# Patient Record
Sex: Male | Born: 1965 | Race: White | Hispanic: No | Marital: Married | Smoking: Never smoker
Health system: Southern US, Community
[De-identification: ages and names within clinical notes are randomized; demographics above are authoritative.]

## PROBLEM LIST (undated history)

## (undated) DIAGNOSIS — Z86010 Personal history of colonic polyps: Secondary | ICD-10-CM

## (undated) DIAGNOSIS — T7840XA Allergy, unspecified, initial encounter: Secondary | ICD-10-CM

## (undated) DIAGNOSIS — J302 Other seasonal allergic rhinitis: Secondary | ICD-10-CM

## (undated) DIAGNOSIS — K219 Gastro-esophageal reflux disease without esophagitis: Secondary | ICD-10-CM

## (undated) DIAGNOSIS — B399 Histoplasmosis, unspecified: Secondary | ICD-10-CM

## (undated) DIAGNOSIS — Z800 Family history of malignant neoplasm of digestive organs: Secondary | ICD-10-CM

## (undated) DIAGNOSIS — Z8042 Family history of malignant neoplasm of prostate: Secondary | ICD-10-CM

## (undated) DIAGNOSIS — M1990 Unspecified osteoarthritis, unspecified site: Secondary | ICD-10-CM

## (undated) DIAGNOSIS — C4491 Basal cell carcinoma of skin, unspecified: Secondary | ICD-10-CM

## (undated) DIAGNOSIS — I639 Cerebral infarction, unspecified: Secondary | ICD-10-CM

## (undated) DIAGNOSIS — C4492 Squamous cell carcinoma of skin, unspecified: Secondary | ICD-10-CM

## (undated) DIAGNOSIS — A048 Other specified bacterial intestinal infections: Secondary | ICD-10-CM

## (undated) HISTORY — DX: Family history of malignant neoplasm of prostate: Z80.42

## (undated) HISTORY — DX: Family history of malignant neoplasm of digestive organs: Z80.0

## (undated) HISTORY — DX: Basal cell carcinoma of skin, unspecified: C44.91

## (undated) HISTORY — DX: Other seasonal allergic rhinitis: J30.2

## (undated) HISTORY — DX: Histoplasmosis, unspecified: B39.9

## (undated) HISTORY — PX: HERNIA REPAIR: SHX51

## (undated) HISTORY — DX: Other specified bacterial intestinal infections: A04.8

## (undated) HISTORY — DX: Cerebral infarction, unspecified: I63.9

## (undated) HISTORY — DX: Gastro-esophageal reflux disease without esophagitis: K21.9

## (undated) HISTORY — DX: Allergy, unspecified, initial encounter: T78.40XA

## (undated) HISTORY — DX: Personal history of colonic polyps: Z86.010

## (undated) HISTORY — PX: UPPER GASTROINTESTINAL ENDOSCOPY: SHX188

## (undated) HISTORY — PX: KNEE ARTHROSCOPY: SUR90

## (undated) HISTORY — PX: UMBILICAL HERNIA REPAIR: SHX196

## (undated) HISTORY — DX: Squamous cell carcinoma of skin, unspecified: C44.92

---

## 1989-08-18 HISTORY — PX: INGUINAL HERNIA REPAIR: SUR1180

## 2008-05-19 DIAGNOSIS — D229 Melanocytic nevi, unspecified: Secondary | ICD-10-CM

## 2008-05-19 DIAGNOSIS — C4491 Basal cell carcinoma of skin, unspecified: Secondary | ICD-10-CM

## 2008-05-19 HISTORY — DX: Basal cell carcinoma of skin, unspecified: C44.91

## 2008-05-19 HISTORY — DX: Melanocytic nevi, unspecified: D22.9

## 2013-08-18 HISTORY — PX: CYSTOSCOPY: SUR368

## 2015-04-17 ENCOUNTER — Encounter: Payer: Self-pay | Admitting: Internal Medicine

## 2015-06-18 ENCOUNTER — Telehealth: Payer: Self-pay

## 2015-06-18 NOTE — Telephone Encounter (Signed)
Left message for patient to try and get his GI records from 5-6 years ago faxed to [place] before his 06/25/15 appointment.

## 2015-06-25 ENCOUNTER — Ambulatory Visit (INDEPENDENT_AMBULATORY_CARE_PROVIDER_SITE_OTHER): Payer: Managed Care, Other (non HMO) | Admitting: Internal Medicine

## 2015-06-25 ENCOUNTER — Encounter: Payer: Self-pay | Admitting: Internal Medicine

## 2015-06-25 VITALS — BP 114/82 | HR 72 | Ht 74.0 in | Wt 221.2 lb

## 2015-06-25 DIAGNOSIS — R1011 Right upper quadrant pain: Secondary | ICD-10-CM

## 2015-06-25 DIAGNOSIS — K648 Other hemorrhoids: Secondary | ICD-10-CM

## 2015-06-25 DIAGNOSIS — R1314 Dysphagia, pharyngoesophageal phase: Secondary | ICD-10-CM

## 2015-06-25 DIAGNOSIS — R1310 Dysphagia, unspecified: Secondary | ICD-10-CM

## 2015-06-25 DIAGNOSIS — R1319 Other dysphagia: Secondary | ICD-10-CM

## 2015-06-25 NOTE — Progress Notes (Signed)
Referred by: Dr. Emelda Fear, [facility]  Subjective:    Patient ID: Andrew Ryan, male    DOB: [birth year], 49 y.o.   MRN: [id number] Cc: swallowing problems HPI The patient is here with several issues. He was originally having trouble with right upper quadrant pain that was sort of sharp and without clear triggers but also be a dull pain. He had an ultrasound that was normal i.e. no gallstones. This pain is persistent but less frequent and less intense He has also had several other problems, that occurred in the background of chronic heartburn since the 1990s which is generally been controlled on PPIs. He had an endoscopy back in the 1990s that did not show anything remarkable apparently though I don't have those records.Intermittent solid dysphagia occurs, hamburgers chicken breast and which are particular triggers, he gets intense chest pain, he will regurgitate at times. There is no unintentional weight loss or bleeding.  He is also describing a reduced frequency of stools 3x/day to sometimes 1x/day - no persistent change in stool caliber and he will often still have normal 3 times a day defecation. He also has years of symptoms with what he describes as hemorrhoids - anal irritation and swelling often after heavy lifting with slight red blood on paper - pattern same x years - prep H helps - "I have a terrible diet" not mch fiber  His primary care provider who treated him for H. pylori positive serology in the past and offered an empiric retreatment which the patient declined Medications, allergies, past medical history, past surgical history, family history and social history are reviewed and updated in the EMR.  Review of Systems As per history of present illness. All other review of systems are negative. She does have an elevated PSA which he has had in the past and then it is gone down denies any prostate symptoms    Objective:   Physical Exam @BP  114/82 mmHg  Pulse 72   Ht 6\' 2"  (1.88 m)  Wt 221 lb 4 oz (100.358 kg)  BMI 28.39 kg/m2@  General:  Well-developed, well-nourished and in no acute distress Eyes:  anicteric. ENT:   Mouth and posterior pharynx free of lesions.  Neck:   supple w/o thyromegaly or mass.  Lungs: Clear to auscultation bilaterally. Heart:  S1S2, no rubs, murmurs, gallops. Abdomen:  soft, non-tender, no hepatosplenomegaly, hernia, or mass and BS+.  Rectal: NL anoderm, no mass Prostate is NL  Anoscopy - Gr 1 internal hemorrhoids - inflamed, slightly  Lymph:  no cervical or supraclavicular adenopathy. Extremities:   no edema, cyanosis or clubbing Skin   no rash. Neuro:  A&O x 3.  Psych:  appropriate mood and  Affect.   Data Reviewed:  Primary care notes 02/14/2015 Normal CBC June 2016 normal LFTs same date normal TSH, normal ultrasound of the gallbladder July 12 16     Assessment & Plan:    1. Esophageal dysphagia   2. Abdominal pain, right upper quadrant   3. Internal hemorrhoids with complication      1. EGD w/ dilation 11/21 The risks and benefits as well as alternatives of endoscopic procedure(s) have been discussed and reviewed. All questions answered. The patient agrees to proceed. Stay on PPI Chew food well in the interim prior to endoscopy food into small pieces  benefiber for hemorrhoids I will review the importance of stopping smokeless tobacco with the patient when he returns  I appreciate the opportunity to care for this patient. CC: [place],  DO

## 2015-06-25 NOTE — Patient Instructions (Signed)
You have been scheduled for an endoscopy. Please follow written instructions given to you at your visit today. If you use inhalers (even only as needed), please bring them with you on the day of your procedure. Your physician has requested that you go to www.startemmi.com and enter the access code given to you at your visit today. This web site gives a general overview about your procedure. However, you should still follow specific instructions given to you by our office regarding your preparation for the procedure.  Today you have been given a handout to read and follow on Benefiber.  I appreciate the opportunity to care for you. Silvano Rusk, MD, [facility]

## 2015-07-09 ENCOUNTER — Ambulatory Visit (AMBULATORY_SURGERY_CENTER): Payer: Managed Care, Other (non HMO) | Admitting: Internal Medicine

## 2015-07-09 ENCOUNTER — Encounter: Payer: Self-pay | Admitting: Internal Medicine

## 2015-07-09 VITALS — BP 125/76 | HR 56 | Temp 96.4°F | Resp 12 | Ht 74.0 in | Wt 221.0 lb

## 2015-07-09 DIAGNOSIS — R1314 Dysphagia, pharyngoesophageal phase: Secondary | ICD-10-CM | POA: Diagnosis not present

## 2015-07-09 DIAGNOSIS — R1310 Dysphagia, unspecified: Secondary | ICD-10-CM

## 2015-07-09 DIAGNOSIS — R1319 Other dysphagia: Secondary | ICD-10-CM

## 2015-07-09 DIAGNOSIS — K222 Esophageal obstruction: Secondary | ICD-10-CM | POA: Diagnosis not present

## 2015-07-09 MED ORDER — SODIUM CHLORIDE 0.9 % IV SOLN: 500.0000 mL | INTRAVENOUS | Status: DC

## 2015-07-09 NOTE — Progress Notes (Signed)
Procedure ends, to recovery, report to [place], RN, VSS

## 2015-07-09 NOTE — Progress Notes (Signed)
In recovery room ,Andrew Ryan expectorated bright red blood approx. 4-5 times which was shown to Dr. Carlean Purl and Dr Carlean Purl spoke with Andrew Ryan about how he expected this amount of bleeding .

## 2015-07-09 NOTE — Progress Notes (Signed)
Called to room to assist during endoscopic procedure.  Patient ID and intended procedure confirmed with present staff. Received instructions for my participation in the procedure from the performing physician.  

## 2015-07-09 NOTE — Op Note (Signed)
[place]  [address] [place], [address]   ENDOSCOPY PROCEDURE REPORT  PATIENT: Andrew Ryan, Andrew Ryan  MR#: [id number] BIRTHDATE: 08-Jun-1966 , 49  yrs. old GENDER: male ENDOSCOPIST: Gatha Mayer, MD, [facility] PROCEDURE DATE:  07/09/2015 PROCEDURE:  EGD w/ biopsy and Maloney dilation of esophagus ASA CLASS:     Class II INDICATIONS:  dysphagia. MEDICATIONS: Propofol 400 mg IV and Monitored anesthesia care TOPICAL ANESTHETIC: none  DESCRIPTION OF PROCEDURE: After the risks benefits and alternatives of the procedure were thoroughly explained, informed consent was obtained.  The [id number] endoscope was introduced through the mouth and advanced to the second portion of the duodenum , Without limitations.  The instrument was slowly withdrawn as the mucosa was fully examined.    1) Multiple rings in esophagus suggesting eosinophilic esophagitis. Biopsies taken then at end of procedure a 54 Fr Maloney dilation performed.  Some difficulty intubating esophagus with that and some oropharyngeal mucosal trauma and heme from that. 2) Otherwies normal EGD.  Retroflexed views revealed no abnormalities.     The scope was then withdrawn from the patient and the procedure completed.  COMPLICATIONS: There were no immediate complications.  ENDOSCOPIC IMPRESSION: 1) Multiple rings in esophagus suggesting eosinophilic esophagitis. Biopsies taken then at end of procedure a 54 Fr Maloney dilation performed.  Some difficulty intubating esophagus with that and some oropharyngeal mucosal trauma and heme from that. 2) Otherwies normal EGD  RECOMMENDATIONS: 1.  Clear liquids until , then soft foods rest of day.  Resume prior diet tomorrow. 2.  Continue PPI 3.  Office will call with results/plans   eSigned:  Gatha Mayer, MD, [facility] 07/09/2015 7:55 AM    CC: Dr. Emelda Fear and The Patient

## 2015-07-09 NOTE — Patient Instructions (Addendum)
  It looks like you have a condition known as Eosinophilic Esophagitis - I took biopsies and dilated the esophagus to help.  Some of your mouth and throat lining was irritated and may bleed some but should heal quickly.  I appreciate the opportunity to care for you. Gatha Mayer, MD, FACG   YOU HAD AN ENDOSCOPIC PROCEDURE TODAY AT [facility]:   Refer to the procedure report that was given to you for any specific questions about what was found during the examination.  If the procedure report does not answer your questions, please call your gastroenterologist to clarify.  If you requested that your care partner not be given the details of your procedure findings, then the procedure report has been included in a sealed envelope for you to review at your convenience later.  YOU SHOULD EXPECT: Some feelings of bloating in the abdomen. Passage of more gas than usual.  Walking can help get rid of the air that was put into your GI tract during the procedure and reduce the bloating. If you had a lower endoscopy (such as a colonoscopy or flexible sigmoidoscopy) you may notice spotting of blood in your stool or on the toilet paper. If you underwent a bowel prep for your procedure, you may not have a normal bowel movement for a few days.  Please Note:  You might notice some irritation and congestion in your nose or some drainage.  This is from the oxygen used during your procedure.  There is no need for concern and it should clear up in a day or so.  SYMPTOMS TO REPORT IMMEDIATELY:     Following upper endoscopy (EGD)  Vomiting of blood or coffee ground material  New chest pain or pain under the shoulder blades  Painful or persistently difficult swallowing  New shortness of breath  Fever of 100F or higher  Black, tarry-looking stools  For urgent or emergent issues, a gastroenterologist can be reached at any hour by calling [phone].   DIET:follow dilatation diet  given to you today  ACTIVITY:  You should plan to take it easy for the rest of today and you should NOT DRIVE or use heavy machinery until tomorrow (because of the sedation medicines used during the test).    FOLLOW UP: Our staff will call the number listed on your records the next business day following your procedure to check on you and address any questions or concerns that you may have regarding the information given to you following your procedure. If we do not reach you, we will leave a message.  However, if you are feeling well and you are not experiencing any problems, there is no need to return our call.  We will assume that you have returned to your regular daily activities without incident.  If any biopsies were taken you will be contacted by phone or by letter within the next 1-3 weeks.  Please call us at [phone] if you have not heard about the biopsies in 3 weeks.    SIGNATURES/CONFIDENTIALITY: You and/or your care partner have signed paperwork which will be entered into your electronic medical record.  These signatures attest to the fact that that the information above on your After Visit Summary has been reviewed and is understood.  Full responsibility of the confidentiality of this discharge information lies with you and/or your care-partner.   Follow dilatation diet today

## 2015-07-10 ENCOUNTER — Telehealth: Payer: Self-pay | Admitting: *Deleted

## 2015-07-10 NOTE — Telephone Encounter (Signed)
Number identifier, left message, follow-up  

## 2015-07-15 NOTE — Progress Notes (Signed)
Quick Note:  Please call from office - biopsies were negative for any inflammation Is he able to swallow ok?  If so stay on PPI and see me prn/1 year If not then let me know  No recall or letter from [facility] ______

## 2016-05-28 ENCOUNTER — Other Ambulatory Visit: Payer: Self-pay | Admitting: Physician Assistant

## 2016-06-02 DIAGNOSIS — K219 Gastro-esophageal reflux disease without esophagitis: Secondary | ICD-10-CM | POA: Insufficient documentation

## 2016-08-18 HISTORY — PX: POLYPECTOMY: SHX149

## 2016-08-18 HISTORY — PX: COLONOSCOPY: SHX174

## 2016-09-18 DIAGNOSIS — I639 Cerebral infarction, unspecified: Secondary | ICD-10-CM

## 2016-09-18 HISTORY — DX: Cerebral infarction, unspecified: I63.9

## 2016-10-03 DIAGNOSIS — I6521 Occlusion and stenosis of right carotid artery: Secondary | ICD-10-CM | POA: Insufficient documentation

## 2016-10-05 DIAGNOSIS — I7771 Dissection of carotid artery: Secondary | ICD-10-CM | POA: Insufficient documentation

## 2016-10-05 DIAGNOSIS — G902 Horner's syndrome: Secondary | ICD-10-CM | POA: Insufficient documentation

## 2016-10-05 DIAGNOSIS — I639 Cerebral infarction, unspecified: Secondary | ICD-10-CM | POA: Insufficient documentation

## 2016-10-28 DIAGNOSIS — E782 Mixed hyperlipidemia: Secondary | ICD-10-CM | POA: Insufficient documentation

## 2017-06-25 ENCOUNTER — Encounter: Payer: Self-pay | Admitting: Internal Medicine

## 2017-07-08 ENCOUNTER — Other Ambulatory Visit: Payer: Self-pay

## 2017-07-08 ENCOUNTER — Ambulatory Visit (AMBULATORY_SURGERY_CENTER): Payer: Self-pay | Admitting: *Deleted

## 2017-07-08 VITALS — Ht 74.0 in | Wt 237.0 lb

## 2017-07-08 DIAGNOSIS — Z1211 Encounter for screening for malignant neoplasm of colon: Secondary | ICD-10-CM

## 2017-07-08 DIAGNOSIS — R195 Other fecal abnormalities: Secondary | ICD-10-CM

## 2017-07-08 NOTE — Progress Notes (Signed)
Patient denies any allergies to eggs or soy. Patient denies any problems with anesthesia/sedation. Patient denies any oxygen use at home. Patient denies taking any diet/weight loss medications or blood thinners. EMMI education assisgned to patient on colonoscopy, this was explained and instructions given to patient. 

## 2017-07-23 ENCOUNTER — Other Ambulatory Visit: Payer: Self-pay

## 2017-07-23 ENCOUNTER — Ambulatory Visit (AMBULATORY_SURGERY_CENTER): Payer: Managed Care, Other (non HMO) | Admitting: Internal Medicine

## 2017-07-23 ENCOUNTER — Encounter: Payer: Self-pay | Admitting: Internal Medicine

## 2017-07-23 VITALS — BP 128/79 | HR 57 | Temp 97.8°F | Resp 23 | Ht 74.0 in | Wt 237.0 lb

## 2017-07-23 DIAGNOSIS — D122 Benign neoplasm of ascending colon: Secondary | ICD-10-CM | POA: Diagnosis not present

## 2017-07-23 DIAGNOSIS — Z1211 Encounter for screening for malignant neoplasm of colon: Secondary | ICD-10-CM | POA: Diagnosis not present

## 2017-07-23 DIAGNOSIS — D120 Benign neoplasm of cecum: Secondary | ICD-10-CM

## 2017-07-23 DIAGNOSIS — Z1212 Encounter for screening for malignant neoplasm of rectum: Secondary | ICD-10-CM

## 2017-07-23 MED ORDER — SODIUM CHLORIDE 0.9 % IV SOLN: 500.0000 mL | Freq: Once | INTRAVENOUS | Status: DC

## 2017-07-23 NOTE — Patient Instructions (Addendum)
I found and removed 6 polyps. All look benign.  I will let you know pathology results and when to have another routine colonoscopy by mail and/or My Chart.  I appreciate the opportunity to care for you. Gatha Mayer, MD, FACG  YOU HAD AN ENDOSCOPIC PROCEDURE TODAY AT [facility]:   Refer to the procedure report that was given to you for any specific questions about what was found during the examination.  If the procedure report does not answer your questions, please call your gastroenterologist to clarify.  If you requested that your care partner not be given the details of your procedure findings, then the procedure report has been included in a sealed envelope for you to review at your convenience later.  YOU SHOULD EXPECT: Some feelings of bloating in the abdomen. Passage of more gas than usual.  Walking can help get rid of the air that was put into your GI tract during the procedure and reduce the bloating. If you had a lower endoscopy (such as a colonoscopy or flexible sigmoidoscopy) you may notice spotting of blood in your stool or on the toilet paper. If you underwent a bowel prep for your procedure, you may not have a normal bowel movement for a few days.  Please Note:  You might notice some irritation and congestion in your nose or some drainage.  This is from the oxygen used during your procedure.  There is no need for concern and it should clear up in a day or so.  SYMPTOMS TO REPORT IMMEDIATELY:   Following lower endoscopy (colonoscopy or flexible sigmoidoscopy):  Excessive amounts of blood in the stool  Significant tenderness or worsening of abdominal pains  Swelling of the abdomen that is new, acute  Fever of 100F or higher   For urgent or emergent issues, a gastroenterologist can be reached at any hour by calling [phone].   DIET:  We do recommend a small meal at first, but then you may proceed to your regular diet.  Drink plenty of  fluids but you should avoid alcoholic beverages for 24 hours.  ACTIVITY:  You should plan to take it easy for the rest of today and you should NOT DRIVE or use heavy machinery until tomorrow (because of the sedation medicines used during the test).    FOLLOW UP: Our staff will call the number listed on your records the next business day following your procedure to check on you and address any questions or concerns that you may have regarding the information given to you following your procedure. If we do not reach you, we will leave a message.  However, if you are feeling well and you are not experiencing any problems, there is no need to return our call.  We will assume that you have returned to your regular daily activities without incident.  If any biopsies were taken you will be contacted by phone or by letter within the next 1-3 weeks.  Please call us at [phone] if you have not heard about the biopsies in 3 weeks.    SIGNATURES/CONFIDENTIALITY: You and/or your care partner have signed paperwork which will be entered into your electronic medical record.  These signatures attest to the fact that that the information above on your After Visit Summary has been reviewed and is understood.  Full responsibility of the confidentiality of this discharge information lies with you and/or your care-partner.   Polyp information given.  No aspirin, naproxen, ibuprofen  or other non steroidal anti inflammatory med for 2 weeks.

## 2017-07-23 NOTE — Progress Notes (Signed)
To PACU, VSS. Report to [email or web address] 

## 2017-07-23 NOTE — Op Note (Signed)
[place] Patient Name: Andrew Ryan Procedure Date: 07/23/2017 2:56 PM MRN: [id number] Endoscopist: Gatha Mayer , MD Age: 51 Referring MD:  Date of Birth: 18-Jun-1966 Gender: Male Account #: [id number] Procedure:                Colonoscopy Indications:              Screening for colorectal malignant neoplasm, This                            is the patient's first colonoscopy Medicines:                Propofol per Anesthesia, Monitored Anesthesia Care Procedure:                Pre-Anesthesia Assessment:                           - Prior to the procedure, a History and Physical                            was performed, and patient medications and                            allergies were reviewed. The patient's tolerance of                            previous anesthesia was also reviewed. The risks                            and benefits of the procedure and the sedation                            options and risks were discussed with the patient.                            All questions were answered, and informed consent                            was obtained. Prior Anticoagulants: The patient has                            taken no previous anticoagulant or antiplatelet                            agents. ASA Grade Assessment: III - A patient with                            severe systemic disease. After reviewing the risks                            and benefits, the patient was deemed in                            satisfactory condition to undergo the procedure.  After obtaining informed consent, the colonoscope                            was passed under direct vision. Throughout the                            procedure, the patient's blood pressure, pulse, and                            oxygen saturations were monitored continuously. The                            Model CF-[id number] scope was introduced                             through the anus and advanced to the the cecum,                            identified by appendiceal orifice and ileocecal                            valve. The colonoscopy was performed without                            difficulty. The patient tolerated the procedure                            well. The quality of the bowel preparation was                            good. The ileocecal valve, appendiceal orifice, and                            rectum were photographed. The bowel preparation                            used was Miralax. Scope In: 3:05:22 PM Scope Out: 3:26:59 PM Scope Withdrawal Time: 0 hours 18 minutes 16 seconds  Total Procedure Duration: 0 hours 21 minutes 37 seconds  Findings:                 The perianal and digital rectal examinations were                            normal. Pertinent negatives include normal prostate                            (size, shape, and consistency).                           Five sessile and semi-pedunculated polyps were                            found in the ascending colon and cecum. The polyps  were 10 to 15 mm in size. These polyps were removed                            with a hot snare. Resection and retrieval were                            complete. Verification of patient identification                            for the specimen was done. Estimated blood loss:                            none.                           A 3 mm polyp was found in the cecum. The polyp was                            sessile. The polyp was removed with a cold snare.                            Resection and retrieval were complete. Verification                            of patient identification for the specimen was                            done. Estimated blood loss was minimal.                           The exam was otherwise without abnormality on                            direct and retroflexion  views. Complications:            No immediate complications. Estimated Blood Loss:     Estimated blood loss was minimal. Impression:               - Five 10 to 15 mm polyps in the ascending colon                            and in the cecum, removed with a hot snare.                            Resected and retrieved.                           - One 3 mm polyp in the cecum, removed with a cold                            snare. Resected and retrieved.                           - The examination was otherwise normal  on direct                            and retroflexion views. Recommendation:           - Patient has a contact number available for                            emergencies. The signs and symptoms of potential                            delayed complications were discussed with the                            patient. Return to normal activities tomorrow.                            Written discharge instructions were provided to the                            patient.                           - Resume previous diet.                           - Continue present medications.                           - No aspirin, ibuprofen, naproxen, or other                            non-steroidal anti-inflammatory drugs for 2 weeks                            after polyp removal.                           - Repeat colonoscopy is recommended. The                            colonoscopy date will be determined after pathology                            results from today's exam become available for                            review. Gatha Mayer, MD 07/23/2017 3:39:42 PM This report has been signed electronically.

## 2017-07-23 NOTE — Progress Notes (Signed)
Pt's states no medical or surgical changes since previsit or office visit. 

## 2017-07-24 ENCOUNTER — Telehealth: Payer: Self-pay | Admitting: *Deleted

## 2017-07-24 NOTE — Telephone Encounter (Signed)
  Follow up Call-  Call back number 07/23/2017 07/09/2015  Post procedure Call Back phone  # [phone]  Permission to leave phone message Yes Yes     Patient questions:  Do you have a fever, pain , or abdominal swelling? No. Pain Score  0 *  Have you tolerated food without any problems? Yes.    Have you been able to return to your normal activities? Yes.    Do you have any questions about your discharge instructions: Diet   No. Medications  No. Follow up visit  No.  Do you have questions or concerns about your Care? No.  Actions: * If pain score is 4 or above: No action needed, pain <4.

## 2017-07-29 ENCOUNTER — Encounter: Payer: Self-pay | Admitting: Internal Medicine

## 2017-07-29 DIAGNOSIS — Z860101 Personal history of adenomatous and serrated colon polyps: Secondary | ICD-10-CM

## 2017-07-29 DIAGNOSIS — Z86010 Personal history of colonic polyps: Secondary | ICD-10-CM

## 2017-07-29 HISTORY — DX: Personal history of colonic polyps: Z86.010

## 2017-07-29 HISTORY — DX: Personal history of adenomatous and serrated colon polyps: Z86.0101

## 2017-07-29 NOTE — Progress Notes (Signed)
6 adenomas max 15 mm recall 2021

## 2017-12-30 ENCOUNTER — Other Ambulatory Visit: Payer: Self-pay | Admitting: Physician Assistant

## 2018-08-26 ENCOUNTER — Telehealth: Payer: Self-pay

## 2018-08-26 ENCOUNTER — Encounter: Payer: Managed Care, Other (non HMO) | Admitting: Cardiothoracic Surgery

## 2018-08-26 NOTE — Telephone Encounter (Signed)
Patient contacted the office stating that his insurance denied the PET scan needed before his first appointment.  Patient stated that insurance wants a biopsy done first.  Patient advised to keep his appointment with Dr. Prescott Gum for 09/07/2018 to discuss plan for mediastinal mass.  Patient acknowledged receipt.

## 2018-09-07 ENCOUNTER — Institutional Professional Consult (permissible substitution) (INDEPENDENT_AMBULATORY_CARE_PROVIDER_SITE_OTHER): Payer: Managed Care, Other (non HMO) | Admitting: Cardiothoracic Surgery

## 2018-09-07 ENCOUNTER — Other Ambulatory Visit: Payer: Self-pay

## 2018-09-07 ENCOUNTER — Encounter: Payer: Self-pay | Admitting: Cardiothoracic Surgery

## 2018-09-07 ENCOUNTER — Other Ambulatory Visit: Payer: Self-pay | Admitting: *Deleted

## 2018-09-07 VITALS — BP 121/84 | HR 78 | Resp 18 | Ht 74.0 in | Wt 236.0 lb

## 2018-09-07 DIAGNOSIS — Z8619 Personal history of other infectious and parasitic diseases: Secondary | ICD-10-CM | POA: Diagnosis not present

## 2018-09-07 DIAGNOSIS — J9859 Other diseases of mediastinum, not elsewhere classified: Secondary | ICD-10-CM | POA: Diagnosis not present

## 2018-09-07 NOTE — Progress Notes (Signed)
PCP is Emelda Fear, DO Referring Provider is Festus Aloe, MD  Chief Complaint  Patient presents with  . Mediastinal Mass    New patient consultation, Chest CT   Patient examined, images of chest CT scan performed earlier this month personally reviewed and counseled with patient and wife. HPI: 53 year old healthy Caucasian male non-smoker was evaluated by urology[Dr. Eskridge] for elevated PSA.  There is a positive family history of prostate cancer.  Screening CT scan of the chest and abdomen was performed.  A prostate biopsy was performed which was negative.  The chest CT scan showed a 6 to 7 cm ovoid partially calcified probable lymph node in the middle mediastinum between the ascending aorta and the superior vena cava.  There was surrounding smaller calcified lymph nodes in the right hilum.  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  He was subsequently treated with antifungal agent by his primary physician.  The patient probably received amphotericin B because he stated he was quite ill for a few weeks after treatment.  We will try to obtain records of that therapy.  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  The superior vena cava has definite external compression but remains patent.  There is no erosion into the aorta.  The patient demonstrates few symptoms of this mediastinal enlarged lymph node-no cough, no swelling of the neck or upper extremities, no varicosities from collaterals forming in his lower neck and upper chest, no hemoptysis, no broncholith production.  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  We will try to obtain records and copies of the CT images.  A repeat mediastinoscopy and biopsy would be extremely dangerous because of the previous procedure and adhesions in the mediastinum.  The risk for hemorrhage would be prohibitive.  I have recommended to the patient we proceed with a PET scan to see if there is metabolic activity present.  If low-level metabolic activity is present then I would refer the patient to infectious disease to consider further medical therapy.  If the mass shows extreme metabolic activity then there would be a concern over malignant transformation.  Currently the patient has no symptoms of vascular or airway compromise by this large lymphoid mass.  He will continue his current job and normal activities without restriction at this time.  Past Medical History:  Diagnosis Date  . GERD (gastroesophageal reflux disease)   . H. pylori infection   . Histoplasmosis   . Hx of adenomatous colonic polyps 07/29/2017  . Skin cancer, basal cell   . Squamous cell skin cancer   . Stroke [facility]) 09/2016    Past Surgical History:  Procedure Laterality Date  . HERNIA REPAIR     x 2 umbilical and inguinal  . KNEE ARTHROSCOPY Bilateral   . UPPER GASTROINTESTINAL ENDOSCOPY      Family History  Problem Relation Age of Onset  . Congestive Heart Failure Father   . COPD Father   . Lung cancer Paternal Grandfather        smoker, Ecologist  . Lung cancer Maternal Grandfather  smoker, Ecologist  . Heart attack Maternal Grandfather   . Colon cancer Neg Hx   . Esophageal cancer Neg Hx   . Stomach cancer Neg Hx     Social History Social History   Tobacco Use  . Smoking status: Never Smoker  . Smokeless tobacco: Never Used  Substance Use Topics  . Alcohol use: Yes    Alcohol/week: 0.0 standard drinks    Comment: occ  . Drug use: No    Current Outpatient Medications  Medication Sig Dispense  Refill  . ASPIRIN 81 PO Take 1 tablet by mouth daily.    Marland Kitchen atorvastatin (LIPITOR) 20 MG tablet Take 1 tablet by mouth daily.  6  . B Complex-C (SUPER B COMPLEX PO) Take 1 tablet by mouth daily.    . cetirizine (ZYRTEC) 10 MG tablet Take 10 mg by mouth daily.    Marland Kitchen omeprazole (PRILOSEC) 40 MG capsule Take 1 capsule by mouth daily.     Current Facility-Administered Medications  Medication Dose Route Frequency Provider Last Rate Last Dose  . 0.9 %  sodium chloride infusion  500 mL Intravenous Once Gatha Mayer, MD        No Known Allergies  Review of Systems                     Review of Systems :  [ y ] = yes, [  ] = no        General :  Weight gain [   ]    Weight loss  [   ]  Fatigue [  ]  Fever [  ]  Chills  [  ]                                          HEENT    Headache [  ]  Dizziness [  ]  Blurred vision [  ] Glaucoma  [  ]                          Nosebleeds [  ] Painful or loose teeth [  ]        Cardiac :  Chest pain/ pressure [  ]  Resting SOB [  ] exertional SOB [  ]                        Orthopnea [  ]  Pedal edema  [  ]  Palpitations [  ] Syncope/presyncope [ ]                         Paroxysmal nocturnal dyspnea [  ]         Pulmonary : cough [  ]  wheezing [  ]  Hemoptysis [  ] Sputum [  ] Snoring [  ]                              Pneumothorax [  ]  Sleep apnea [  ]        GI : Vomiting [  ]  Dysphagia [ y ]  Melena  [  ]  Abdominal pain [  ] BRBPR [  ]  Heart burn [ yes long history of GERD]  Constipation [  ] Diarrhea  [  ] Colonoscopy [   ]        GU : Hematuria [  ]  Dysuria [  ]  Nocturia [  ] UTI's [  ]        Vascular : Claudication [  ]  Rest pain [  ]  DVT [  ] Vein stripping [  ] leg ulcers [  ]                          TIA [  ] Stroke [history spontaneous right carotid dissection 2018 without neurologic sequela, treated nonsurgically]  Varicose veins [  ]        NEURO :  Headaches  [  ] Seizures [  ] Vision changes [  ] Paresthesias [   ]      right-hand-dominant                                         Musculoskeletal :  Arthritis [  ] Gout  [  ]  Back pain [  ]  Joint pain [  ]        Skin :  Rash [  ]  Melanoma [  ] Sores [  ]        Heme : Bleeding problems [  ]Clotting Disorders [  ] Anemia [  ]Blood Transfusion [ ]         Endocrine : Diabetes [  ] Heat or Cold intolerance [  ] Polyuria [  ]excessive thirst [ ]         Psych : Depression [  ]  Anxiety [  ]  Psych hospitalizations [  ] Memory change [  ]                                                                            BP 121/84 (BP Location: Right Arm, Patient Position: Sitting, Cuff Size: Large)   Pulse 78   Resp 18   Ht 6\' 2"  (1.88 m)   Wt 236 lb (107 kg)   SpO2 97% Comment: RA  BMI 30.30 kg/m  Physical Exam      Physical Exam  General: Well-nourished middle-aged Caucasian male no acute distress accompanied by wife HEENT: Normocephalic pupils equal , dentition adequate Neck: Supple without JVD, adenopathy, or bruit.  Well-healed surgical scar at the sternal notch from previous mediastinoscopy. Chest: Clear to auscultation, symmetrical breath sounds, no rhonchi, no tenderness             or deformity Cardiovascular: Regular rate and rhythm, no murmur, no gallop, peripheral pulses             palpable in all extremities Abdomen:  Soft, nontender, no palpable mass or organomegaly Extremities: Warm, well-perfused, no clubbing cyanosis edema or tenderness,              no venous stasis changes of the legs Rectal/GU: Deferred Neuro: Grossly non--focal and symmetrical throughout Skin: Clean  and dry without rash or ulceration  Diagnostic Tests: CT of chest images personally reviewed and demonstrated to patient and wife.  Large partially calcified lymphoid mass in the mid mediastinum between the aorta and SVC most likely result of previous histoplasmosis.  Scattered calcified smaller nodes at the right hilum.  The size of the mass is unusual and  I recommended PET scan to assess the malignant potential.  Repeat biopsy of this area would be extremely high risk for hemorrhage because of the previous mediastinoscopy and scarred dissection planes through this region of major blood vessels [innominate artery, superior vena cava].  Transthoracic biopsy would not be possible.  Impression: Incidental finding of a large mediastinal lymphoid calcified mass probably related to previous histoplasmosis.  This is asymptomatic.  Plan: Proceed with PET scan.  Patient may need consultation by infectious disease.  We will see patient back to discuss results of PET scan.   Len Childs, MD [facility] [phone]

## 2018-09-09 ENCOUNTER — Encounter: Payer: Self-pay | Admitting: *Deleted

## 2018-09-17 ENCOUNTER — Ambulatory Visit (HOSPITAL_COMMUNITY)
Admission: RE | Admit: 2018-09-17 | Discharge: 2018-09-17 | Disposition: A | Discharge status: Home / Self Care | Payer: Managed Care, Other (non HMO) | Source: Ambulatory Visit | Attending: Cardiothoracic Surgery | Admitting: Cardiothoracic Surgery

## 2018-09-17 DIAGNOSIS — J9859 Other diseases of mediastinum, not elsewhere classified: Secondary | ICD-10-CM | POA: Diagnosis present

## 2018-09-17 LAB — GLUCOSE, CAPILLARY: Glucose-Capillary: 99 mg/dL (ref 70–99)

## 2018-09-17 IMAGING — PT NM PET TUM IMG INITIAL (PI) SKULL BASE T - THIGH
1 of 8 series · 1 of 25 positions shown · non-contrast
Comparison: None.

CLINICAL DATA: Initial treatment strategy for mediastinal mass.

EXAM:
NUCLEAR MEDICINE PET SKULL BASE TO THIGH
TECHNIQUE: 11.1 mCi F-18 FDG was injected intravenously. Full-ring PET imaging
was performed from the skull base to thigh after the radiotracer. CT
data was obtained and used for attenuation correction and anatomic
localization.
Fasting blood glucose: 99 mg/dl

[Series 4: ct sk_thigh 5.0 b31f · axial · 5.0mm · 0.98mm/px · 1 of 244 slices shown]
[im 244/244  brain]
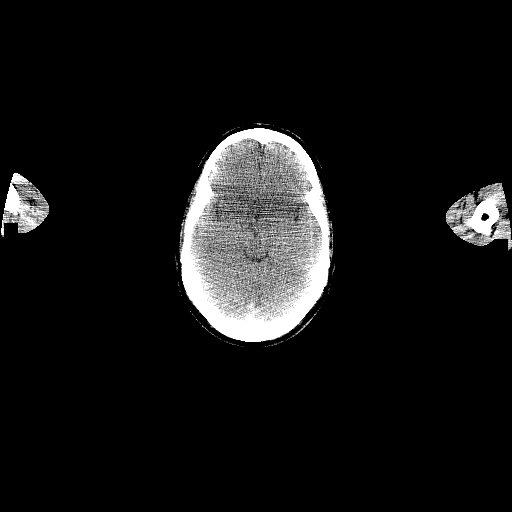

[1 of 25 positions shown; findings below may reference images not displayed]

FINDINGS: Mediastinal blood pool activity: SUV max

NECK: No hypermetabolic lymph nodes in the neck.

Incidental CT findings: none

CHEST: The periphery calcified multilobar anterior mediastinal mass
is completely devoid of metabolic activity consistent with a benign
a vascularized lesion. There is a subtle rim of metabolic activity
along the anterior inferior margin of the calcified mass SUV max
equal

No hypermetabolic or suspicious pulmonary nodules. There is a
calcified nodule in the azygoesophageal recess. Calcified hilar
lymph nodes on the RIGHT noted

Incidental CT findings: none

ABDOMEN/PELVIS: No abnormal metabolic activity in the abdomen
pelvis.

Granulomata within the spleen.  Benign cysts of the LEFT kidney

Incidental CT findings: none

SKELETON: No focal hypermetabolic activity to suggest skeletal
metastasis. Mild expansion of the posterior LEFT eighth rib without
associated metabolic activity.

Incidental CT findings: none
IMPRESSION: 1. Large calcified anterior mediastinal mass is devoid of metabolic
activity which would indicate a benign avascular lesion. Trace mild
uptake along the margin of the lesion inferiorly is favored
inflammatory.
2. Additional findings of granulomatous disease including calcified
hilar nodes, calcified pulmonary nodule, and splenic granulomata.
Favor the large calcified mediastinal mass to be part of this same
benign granulomatous process.

## 2018-09-17 MED ORDER — FLUDEOXYGLUCOSE F - 18 (FDG) INJECTION
11.1000 | Freq: Once | INTRAVENOUS | Status: AC | PRN
  Administered 2018-09-17: 11.1 via INTRAVENOUS

## 2018-09-22 ENCOUNTER — Ambulatory Visit (INDEPENDENT_AMBULATORY_CARE_PROVIDER_SITE_OTHER): Payer: Managed Care, Other (non HMO) | Admitting: Cardiothoracic Surgery

## 2018-09-22 ENCOUNTER — Encounter: Payer: Self-pay | Admitting: Cardiothoracic Surgery

## 2018-09-22 VITALS — BP 130/89 | HR 67 | Resp 20 | Ht 74.0 in | Wt 238.0 lb

## 2018-09-22 DIAGNOSIS — J9859 Other diseases of mediastinum, not elsewhere classified: Secondary | ICD-10-CM | POA: Diagnosis not present

## 2018-09-22 NOTE — Progress Notes (Signed)
PCP is Emelda Fear, DO Referring Provider is Festus Aloe, MD  Chief Complaint  Patient presents with  . Mediastinal Mass    f/u after PET Scan 09/17/18    HPI: Patient returns for further evaluation and review of PET scan results.  The large 8 cm mid mediastinal mass close to the ascending aorta and trachea had zero metabolic activity consistent with a scarred, avascular lymph node mass from remote history of histoplasmosis at age 53.  No further biopsy or evaluation is needed.  The patient does not need resection.  The images were demonstrated to the patient.  It was noted that he had a large renal cyst on the left kidney.  He will be followed up with Dr. Junious Silk regarding this finding.   Past Medical History:  Diagnosis Date  . GERD (gastroesophageal reflux disease)   . H. pylori infection   . Histoplasmosis   . Hx of adenomatous colonic polyps 07/29/2017  . Skin cancer, basal cell   . Squamous cell skin cancer   . Stroke [facility]) 09/2016    Past Surgical History:  Procedure Laterality Date  . HERNIA REPAIR     x 2 umbilical and inguinal  . KNEE ARTHROSCOPY Bilateral   . UPPER GASTROINTESTINAL ENDOSCOPY      Family History  Problem Relation Age of Onset  . Congestive Heart Failure Father   . COPD Father   . Lung cancer Paternal Grandfather        smoker, Ecologist  . Lung cancer Maternal Grandfather        smoker, Ecologist  . Heart attack Maternal Grandfather   . Colon cancer Neg Hx   . Esophageal cancer Neg Hx   . Stomach cancer Neg Hx     Social History Social History   Tobacco Use  . Smoking status: Never Smoker  . Smokeless tobacco: Never Used  Substance Use Topics  . Alcohol use: Yes    Alcohol/week: 0.0 standard drinks    Comment: occ  . Drug use: No    Current Outpatient Medications  Medication Sig Dispense Refill  . ASPIRIN 81 PO Take 1 tablet by mouth daily.    Marland Kitchen atorvastatin (LIPITOR) 20 MG tablet Take 1 tablet by mouth daily.  6  . B  Complex-C (SUPER B COMPLEX PO) Take 1 tablet by mouth daily.    . cetirizine (ZYRTEC) 10 MG tablet Take 10 mg by mouth daily.    Marland Kitchen omeprazole (PRILOSEC) 40 MG capsule Take 1 capsule by mouth daily.     Current Facility-Administered Medications  Medication Dose Route Frequency Provider Last Rate Last Dose  . 0.9 %  sodium chloride infusion  500 mL Intravenous Once Gatha Mayer, MD        No Known Allergies  Review of Systems  Chest pain No fever Shortness of breath  BP 130/89   Pulse 67   Resp 20   Ht 6\' 2"  (1.88 m)   Wt 238 lb (108 kg)   SpO2 98% Comment: RA  BMI 30.56 kg/m  Physical Exam      Exam    General- alert and comfortable    Neck- no JVD, no cervical adenopathy palpable, no carotid bruit   Lungs- clear without rales, wheezes   Cor- regular rate and rhythm, no murmur , gallop   Abdomen- soft, non-tender   Extremities - warm, non-tender, minimal edema   Neuro- oriented, appropriate, no focal weakness   Diagnostic Tests: PET scan shows  the mediastinal mass to have zero metabolic activity consistent with an avascular scar lymphoid mass from previous histoplasmosis.  No evidence of active disease  Impression: Benign mediastinal mass, no further evaluation or treatment needed  Plan: Return as needed   Len Childs, MD [facility] [phone]

## 2019-04-19 ENCOUNTER — Other Ambulatory Visit: Payer: Self-pay | Admitting: Urology

## 2019-04-19 DIAGNOSIS — N281 Cyst of kidney, acquired: Secondary | ICD-10-CM

## 2019-06-30 ENCOUNTER — Other Ambulatory Visit: Payer: Self-pay

## 2019-06-30 ENCOUNTER — Ambulatory Visit
Admission: RE | Admit: 2019-06-30 | Discharge: 2019-06-30 | Disposition: A | Discharge status: Home / Self Care | Payer: Managed Care, Other (non HMO) | Source: Ambulatory Visit | Attending: Urology | Admitting: Urology

## 2019-06-30 DIAGNOSIS — N281 Cyst of kidney, acquired: Secondary | ICD-10-CM

## 2019-06-30 IMAGING — MR MR ABDOMEN WO/W CM
11 of 17 series · 29 of 48 positions shown · IV contrast (multihance)
Comparison: [DATE]

CLINICAL DATA: Follow-up indeterminate left renal lesion.

EXAM:
MRI ABDOMEN WITHOUT AND WITH CONTRAST
TECHNIQUE: Multiplanar multisequence MR imaging of the abdomen was performed
both before and after the administration of intravenous contrast.
CONTRAST:  20mL MULTIHANCE GADOBENATE DIMEGLUMINE 529 MG/ML IV SOLN

[Series 3: T2 · coronal · 5.0mm · 1.56mm/px · 2 of 25 slices shown (1 of 3)]
[im 1/25]
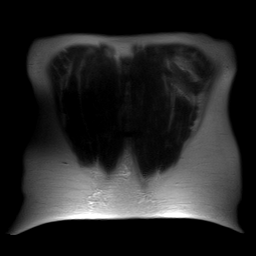
[im 25/25]
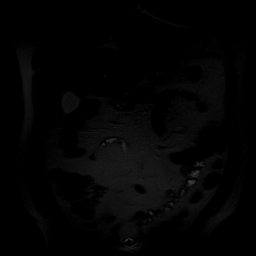

[Series 4: axial tru fisp · axial · 4.5mm · 1.48mm/px · z∈[-87,+107]mm · 2 of 34 slices shown]
[im 1/34]
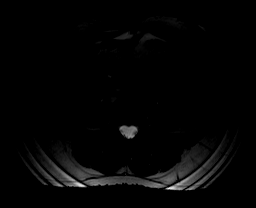
[im 34/34]
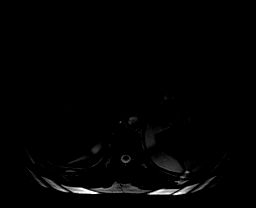

[Series 5: ep2d_diff_b50_500_800_p2 · axial · 6.0mm · 1.98mm/px · z∈[-99,+127]mm · 5 of 90 slices shown]
[im 1/90]
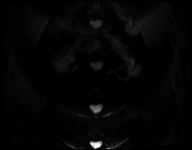
[im 23/90]
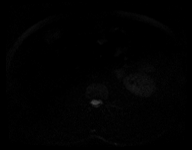
[im 45/90]
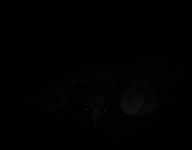
[im 67/90]
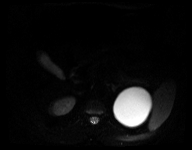
[im 90/90]
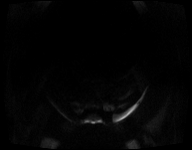

[Series 6: [id number]_[id number]_adc · axial · 6.0mm · 1.98mm/px · z∈[-99,+127]mm · 2 of 30 slices shown]
[im 1/30]
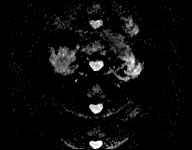
[im 30/30]
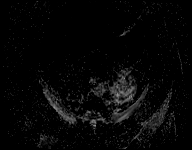

[Series 7: T2 · axial · 6.5mm · 0.74mm/px · z∈[-112,+130]mm · 2 of 32 slices shown (2 of 3)]
[im 1/32]
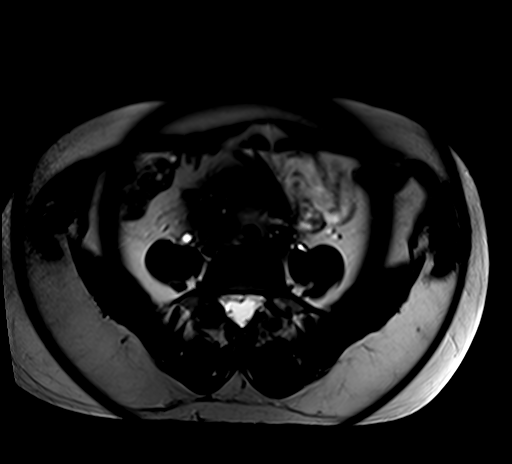
[im 32/32]
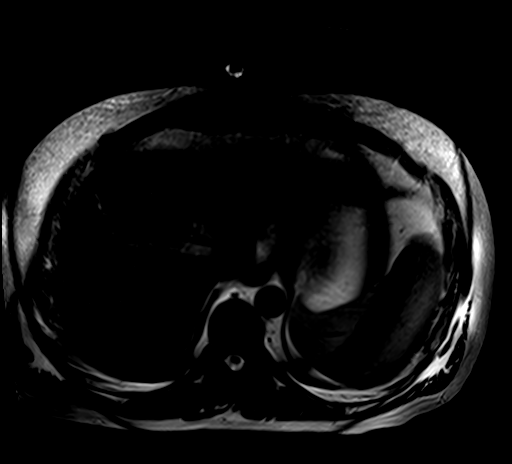

[Series 8: T2 · axial · 5.0mm · 1.37mm/px · z∈[-82,+107]mm · 2 of 30 slices shown (3 of 3)]
[im 1/30]
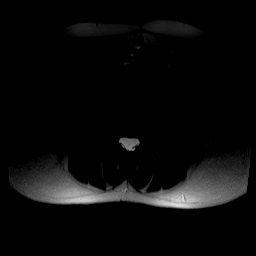
[im 30/30]
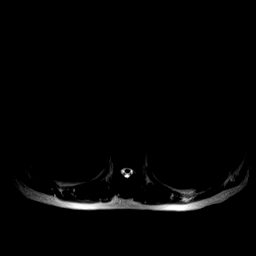

[Series 9: axial in out · axial · 5.5mm · 0.74mm/px · z∈[-79,+104]mm · 3 of 60 slices shown]
[im 1/60]
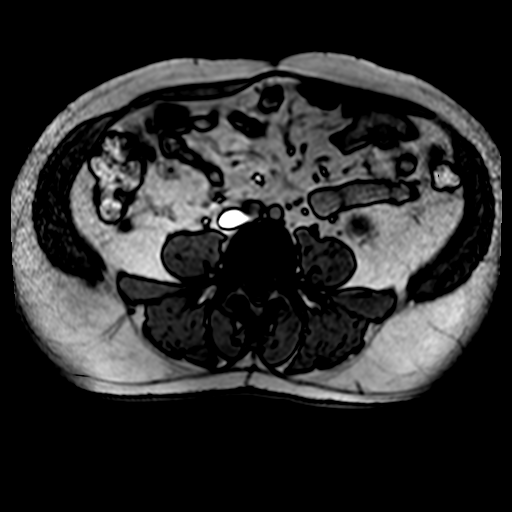
[im 30/60]
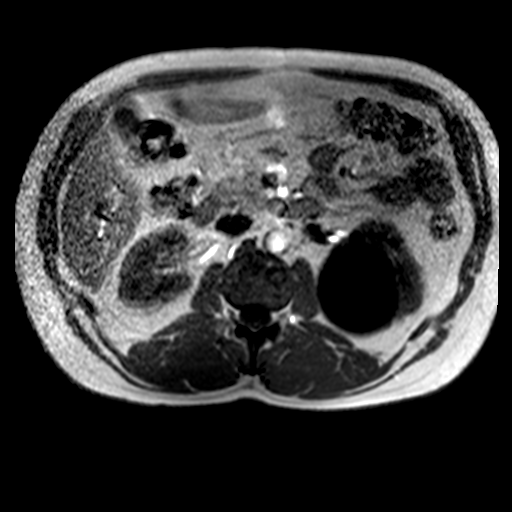
[im 60/60]
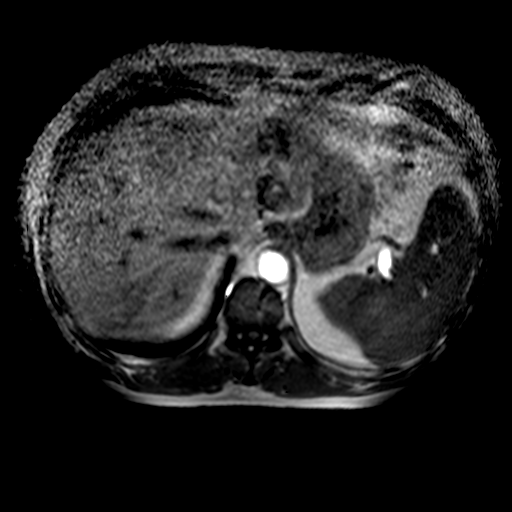

[Series 10: T1 dynamic · axial · non-contrast · 3.0mm · 0.78mm/px · z∈[-92,+97]mm · 3 of 64 slices shown]
[im 1/64]
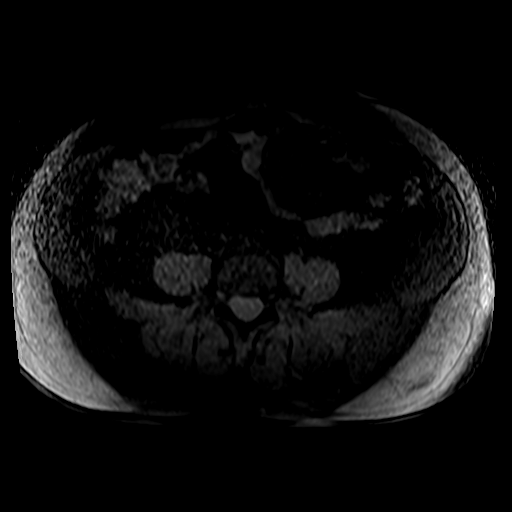
[im 32/64]
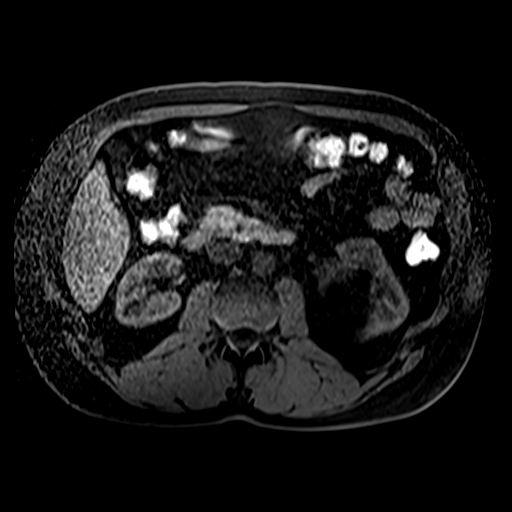
[im 64/64]
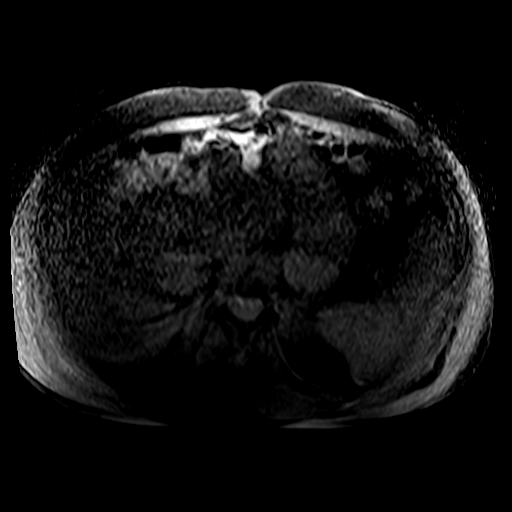

[Series 11: post 25 sec · axial · 3.0mm · 0.78mm/px · z∈[-92,+97]mm · 3 of 64 slices shown]
[im 1/64]
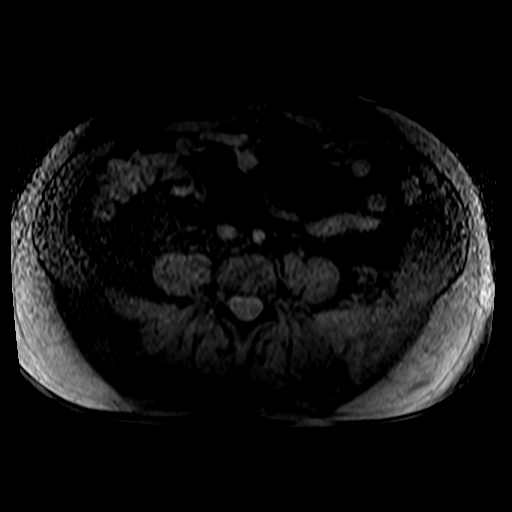
[im 32/64]
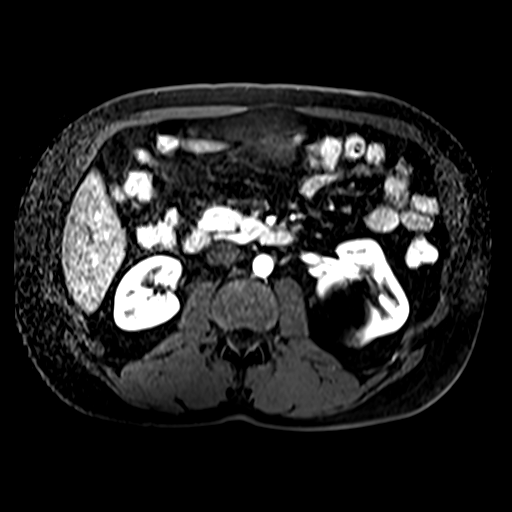
[im 64/64]
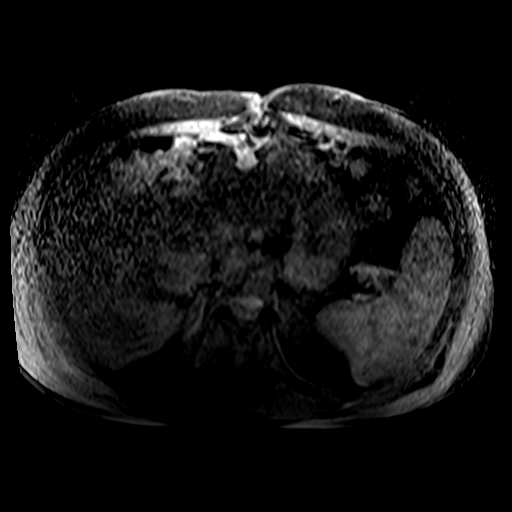

[Series 12: post 25 sec_sub · axial · 3.0mm · 0.78mm/px · z∈[-92,+97]mm · 3 of 63 slices shown]
[im 1/63]
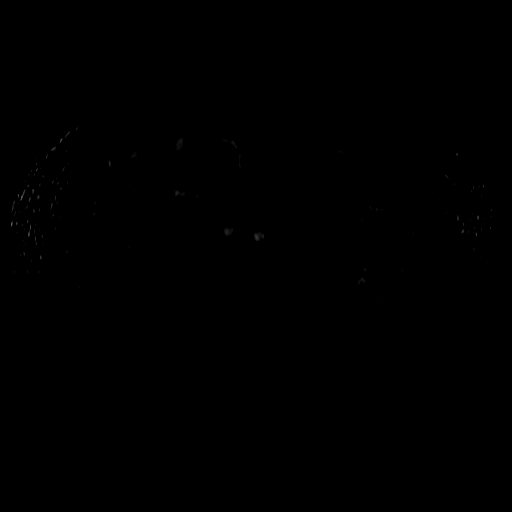
[im 32/63]
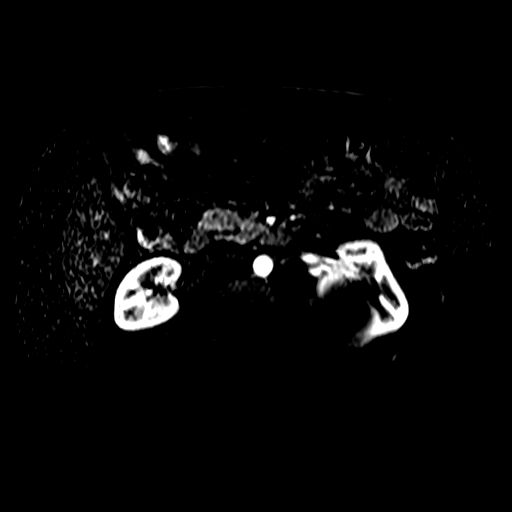
[im 63/63]
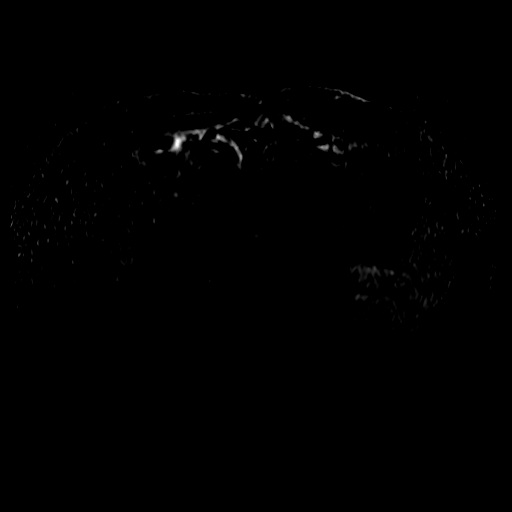

[Series 13: post 45 sec · axial · 3.0mm · 0.78mm/px · z∈[-92,+1]mm · 2 of 64 slices shown]
[im 1/64]
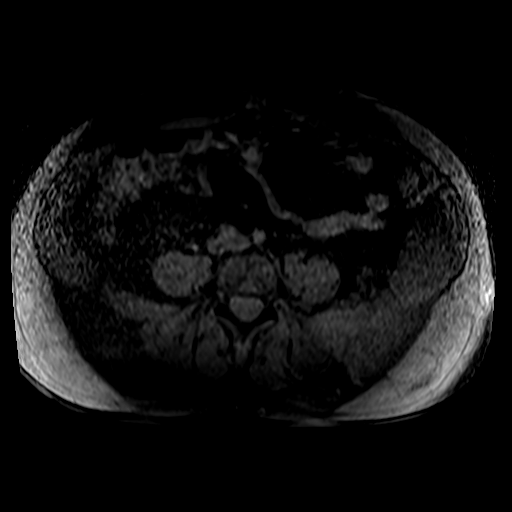
[im 32/64]
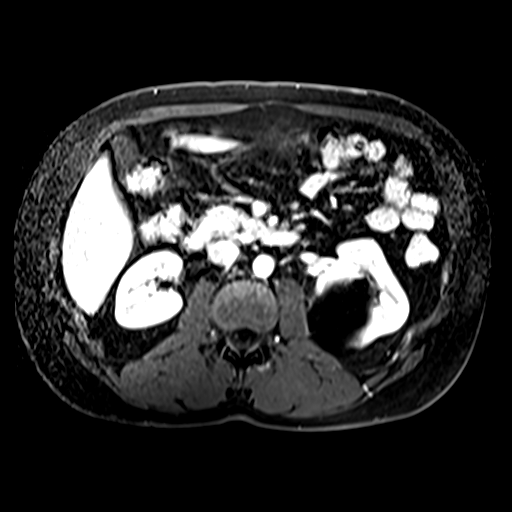

[29 of 48 positions shown; findings below may reference images not displayed]

FINDINGS: Lower chest: No acute findings.

Hepatobiliary: No hepatic masses identified. Mild diffuse hepatic
steatosis. Gallbladder is unremarkable. No evidence of biliary
ductal dilatation.

Pancreas:  No mass or inflammatory changes.

Spleen: Within normal limits in size. Splenic calcifications again
seen, consistent with old granulomatous disease. No mass identified.

Adrenals/Urinary Tract: Several simple renal cysts are again seen
bilaterally, largest in the left kidney measuring 9 cm. 1.3 cm
subcapsular lesion is seen in the lateral upper pole of left kidney
which shows T1 isointensity, T2 hypointensity, probable mild
peripheral rim enhancement on subtraction images. No other complex
features are seen. This remains stable since prior CT, and meets
criteria for a Bosniak category 2F cystic lesion. No new or
enlarging renal lesions are identified. No evidence of
hydronephrosis.

Stomach/Bowel: Visualized portion unremarkable.

Vascular/Lymphatic: No pathologically enlarged lymph nodes
identified. No abdominal aortic aneurysm.

Other:  None.

Musculoskeletal:  No suspicious bone lesions identified.
IMPRESSION: Stable 1.3 cm Bosniak category 2F lesion in upper pole of left
kidney. Recommend continued followup by MRI in 1 year. This
recommendation follows ACR consensus guidelines: Management of the
Incidental Renal Mass on CT: A White Paper of the ACR Incidental
Findings Committee. [HOSPITAL] [E9];[DATE].

No evidence of abdominal metastatic disease.

Mild hepatic steatosis.

## 2019-06-30 MED ORDER — GADOBENATE DIMEGLUMINE 529 MG/ML IV SOLN
20.0000 mL | Freq: Once | INTRAVENOUS | Status: AC | PRN
  Administered 2019-06-30: 20 mL via INTRAVENOUS

## 2019-08-16 ENCOUNTER — Other Ambulatory Visit: Payer: Self-pay | Admitting: Physician Assistant

## 2020-01-20 ENCOUNTER — Other Ambulatory Visit: Payer: Self-pay | Admitting: Urology

## 2020-01-20 DIAGNOSIS — N281 Cyst of kidney, acquired: Secondary | ICD-10-CM

## 2020-01-20 DIAGNOSIS — Z77018 Contact with and (suspected) exposure to other hazardous metals: Secondary | ICD-10-CM

## 2020-01-20 DIAGNOSIS — R972 Elevated prostate specific antigen [PSA]: Secondary | ICD-10-CM

## 2020-02-03 ENCOUNTER — Other Ambulatory Visit: Payer: Self-pay

## 2020-02-03 ENCOUNTER — Ambulatory Visit (INDEPENDENT_AMBULATORY_CARE_PROVIDER_SITE_OTHER): Payer: Managed Care, Other (non HMO) | Admitting: Physician Assistant

## 2020-02-03 DIAGNOSIS — D485 Neoplasm of uncertain behavior of skin: Secondary | ICD-10-CM

## 2020-02-03 DIAGNOSIS — L820 Inflamed seborrheic keratosis: Secondary | ICD-10-CM

## 2020-02-03 DIAGNOSIS — L570 Actinic keratosis: Secondary | ICD-10-CM | POA: Diagnosis not present

## 2020-02-03 DIAGNOSIS — Z1283 Encounter for screening for malignant neoplasm of skin: Secondary | ICD-10-CM

## 2020-02-03 DIAGNOSIS — Z85828 Personal history of other malignant neoplasm of skin: Secondary | ICD-10-CM | POA: Diagnosis not present

## 2020-02-03 NOTE — Patient Instructions (Signed)

## 2020-02-03 NOTE — Progress Notes (Signed)
Follow-Up Visit   Subjective  Andrew Ryan is a 54 y.o. male who presents for the following: Annual Exam (Concerns right side face, back of scalp and sholder. Per patient they are keritosis and they itch. Re check nose prev BCC done at [facility].).   The following portions of the chart were reviewed this encounter and updated as appropriate:     Objective  Well appearing patient in no apparent distress; mood and affect are within normal limits.  All skin waist up examined.  Objective  Head - Anterior (Face), Left Upper Back, Neck - Anterior, Right Zygomatic Area: Erythematous stuck-on, waxy papule or plaque.   Objective  Right Temporal Scalp (2): Erythematous patches with gritty scale.  Objective  Right Parotid Area: Hyperkeratotic scale with pink base      Objective  Left Breast: Brown crust     Objective  Left Flank: Brown crust     Assessment & Plan  Inflamed seborrheic keratosis (4) Head - Anterior (Face); Neck - Anterior; Left Upper Back; Right Zygomatic Area  Destruction of lesion - Head - Anterior (Face), Left Upper Back, Neck - Anterior, Right Zygomatic Area Complexity: simple   Destruction method: cryotherapy   Informed consent: discussed and consent obtained   Timeout:  patient name, date of birth, surgical site, and procedure verified Lesion destroyed using liquid nitrogen: Yes   Cryotherapy cycles:  1 Outcome: patient tolerated procedure well with no complications   Post-procedure details: wound care instructions given    AK (actinic keratosis) (2) Right Temporal Scalp  Destruction of lesion - Right Temporal Scalp Complexity: simple   Destruction method: cryotherapy   Informed consent: discussed and consent obtained   Timeout:  patient name, date of birth, surgical site, and procedure verified Lesion destroyed using liquid nitrogen: Yes   Cryotherapy cycles:  1 Outcome: patient tolerated procedure well with no complications   Post-procedure  details: wound care instructions given    Neoplasm of uncertain behavior of skin (3) Right Parotid Area  Epidermal / dermal shaving  Lesion diameter (cm):  1.2 Informed consent: discussed and consent obtained   Timeout: patient name, date of birth, surgical site, and procedure verified   Procedure prep:  Patient was prepped and draped in usual sterile fashion Prep type:  Chlorhexidine Anesthesia: the lesion was anesthetized in a standard fashion   Anesthetic:  1% lidocaine w/ epinephrine 1-100,000 local infiltration Instrument used: DermaBlade   Hemostasis achieved with: aluminum chloride   Outcome: patient tolerated procedure well   Post-procedure details: sterile dressing applied and wound care instructions given   Dressing type: petrolatum gauze, petrolatum and bandage    Specimen 1 - Surgical pathology Differential Diagnosis: scc vs bcc Check Margins: No  Left Breast  Epidermal / dermal shaving  Lesion diameter (cm):  1.3 Informed consent: discussed and consent obtained   Timeout: patient name, date of birth, surgical site, and procedure verified   Procedure prep:  Patient was prepped and draped in usual sterile fashion Prep type:  Chlorhexidine Anesthesia: the lesion was anesthetized in a standard fashion   Anesthetic:  1% lidocaine w/ epinephrine 1-100,000 local infiltration Instrument used: DermaBlade   Hemostasis achieved with: aluminum chloride   Outcome: patient tolerated procedure well   Post-procedure details: sterile dressing applied and wound care instructions given   Dressing type: petrolatum gauze, petrolatum and bandage    Specimen 2 - Surgical pathology Differential Diagnosis: scc vs bcc Check Margins: No  Left Flank  Epidermal / dermal shaving  Lesion diameter (cm):  1 Informed consent: discussed and consent obtained   Timeout: patient name, date of birth, surgical site, and procedure verified   Procedure prep:  Patient was prepped and draped in  usual sterile fashion Prep type:  Chlorhexidine Anesthesia: the lesion was anesthetized in a standard fashion   Anesthetic:  1% lidocaine w/ epinephrine 1-100,000 local infiltration Instrument used: DermaBlade   Hemostasis achieved with: aluminum chloride   Outcome: patient tolerated procedure well   Post-procedure details: sterile dressing applied and wound care instructions given   Dressing type: petrolatum gauze, petrolatum and bandage    Specimen 3 - Surgical pathology Differential Diagnosis: scc vs bcc Check Margins: No

## 2020-02-15 ENCOUNTER — Other Ambulatory Visit: Payer: Self-pay | Admitting: Urology

## 2020-02-21 ENCOUNTER — Ambulatory Visit
Admission: RE | Admit: 2020-02-21 | Discharge: 2020-02-21 | Disposition: A | Discharge status: Home / Self Care | Payer: Managed Care, Other (non HMO) | Source: Ambulatory Visit | Attending: Urology | Admitting: Urology

## 2020-02-21 DIAGNOSIS — Z77018 Contact with and (suspected) exposure to other hazardous metals: Secondary | ICD-10-CM

## 2020-02-21 DIAGNOSIS — R972 Elevated prostate specific antigen [PSA]: Secondary | ICD-10-CM

## 2020-02-21 IMAGING — CR DG ORBITS FOR FOREIGN BODY
2 series · 2 of 2 positions shown · non-contrast
Comparison: None.

CLINICAL DATA: Metal working/exposure; clearance prior to MRI

EXAM:
ORBITS FOR FOREIGN BODY - 2 VIEW

[w orbit pa (1 of 2)]
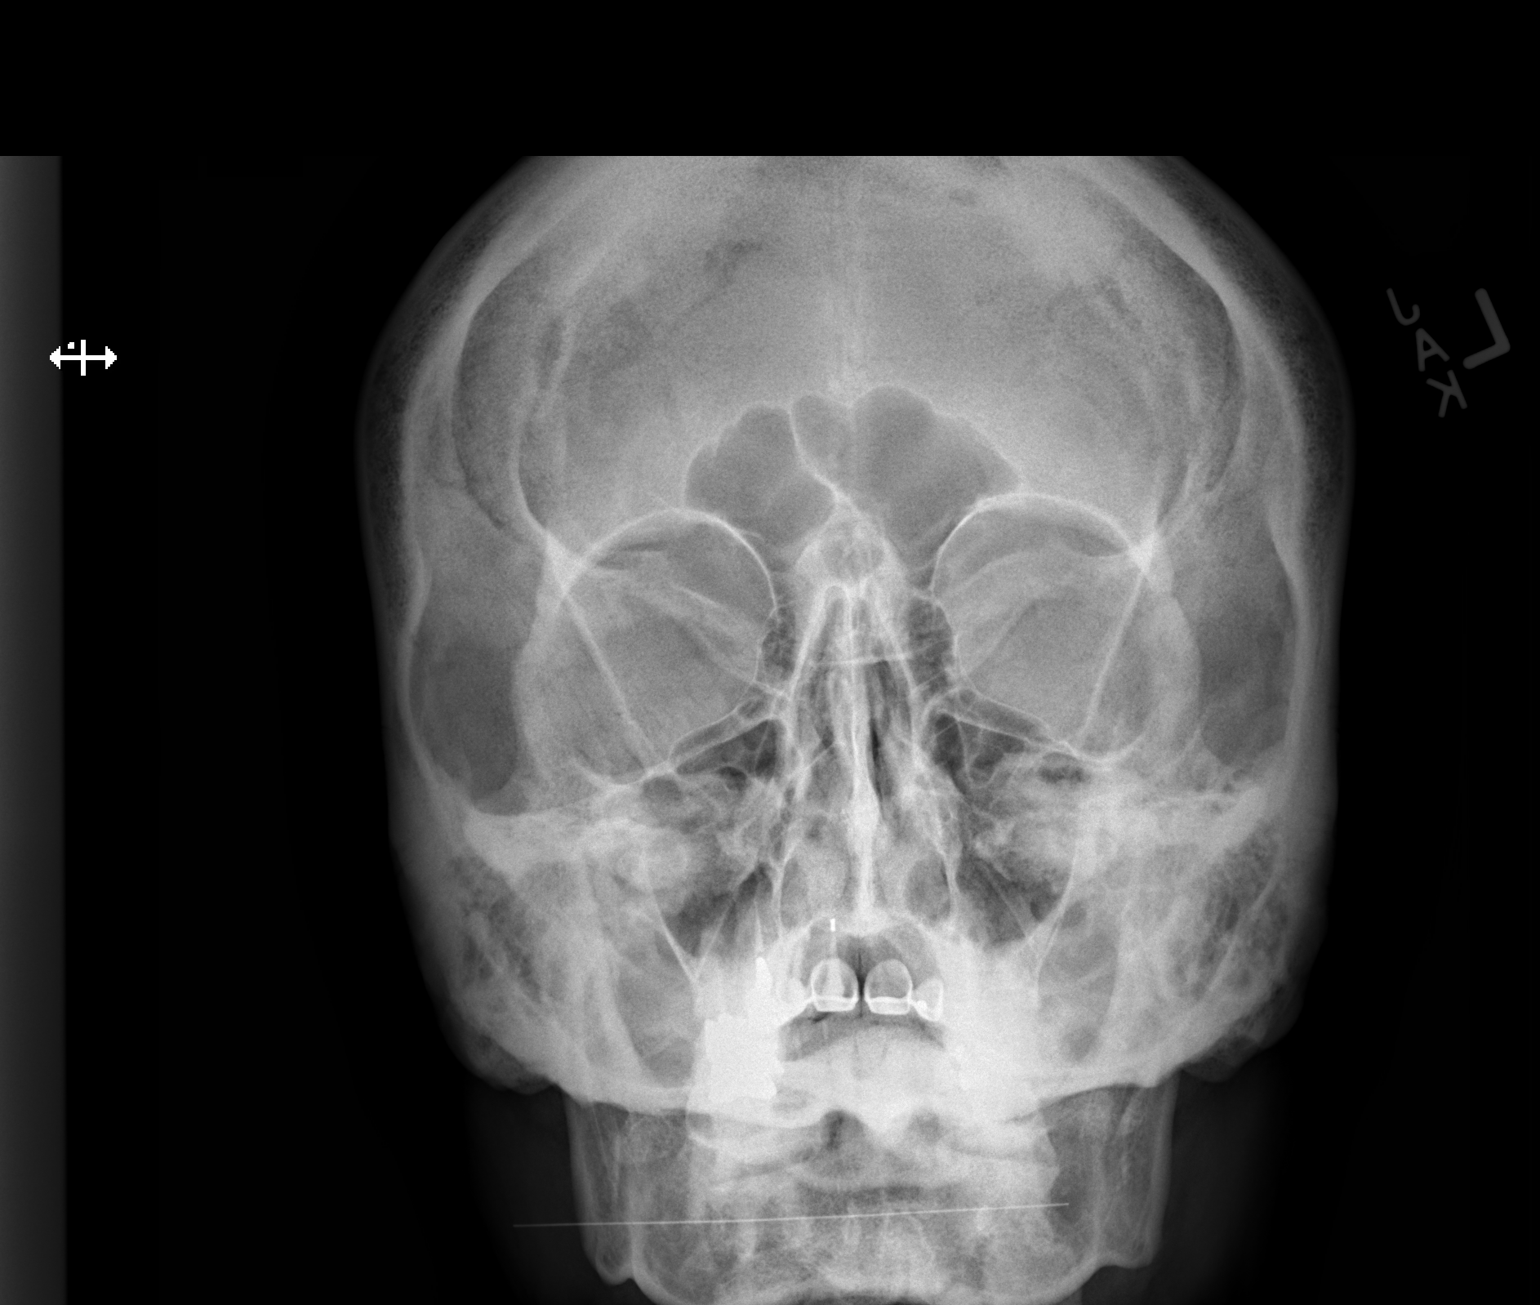

[w orbit pa (2 of 2)]
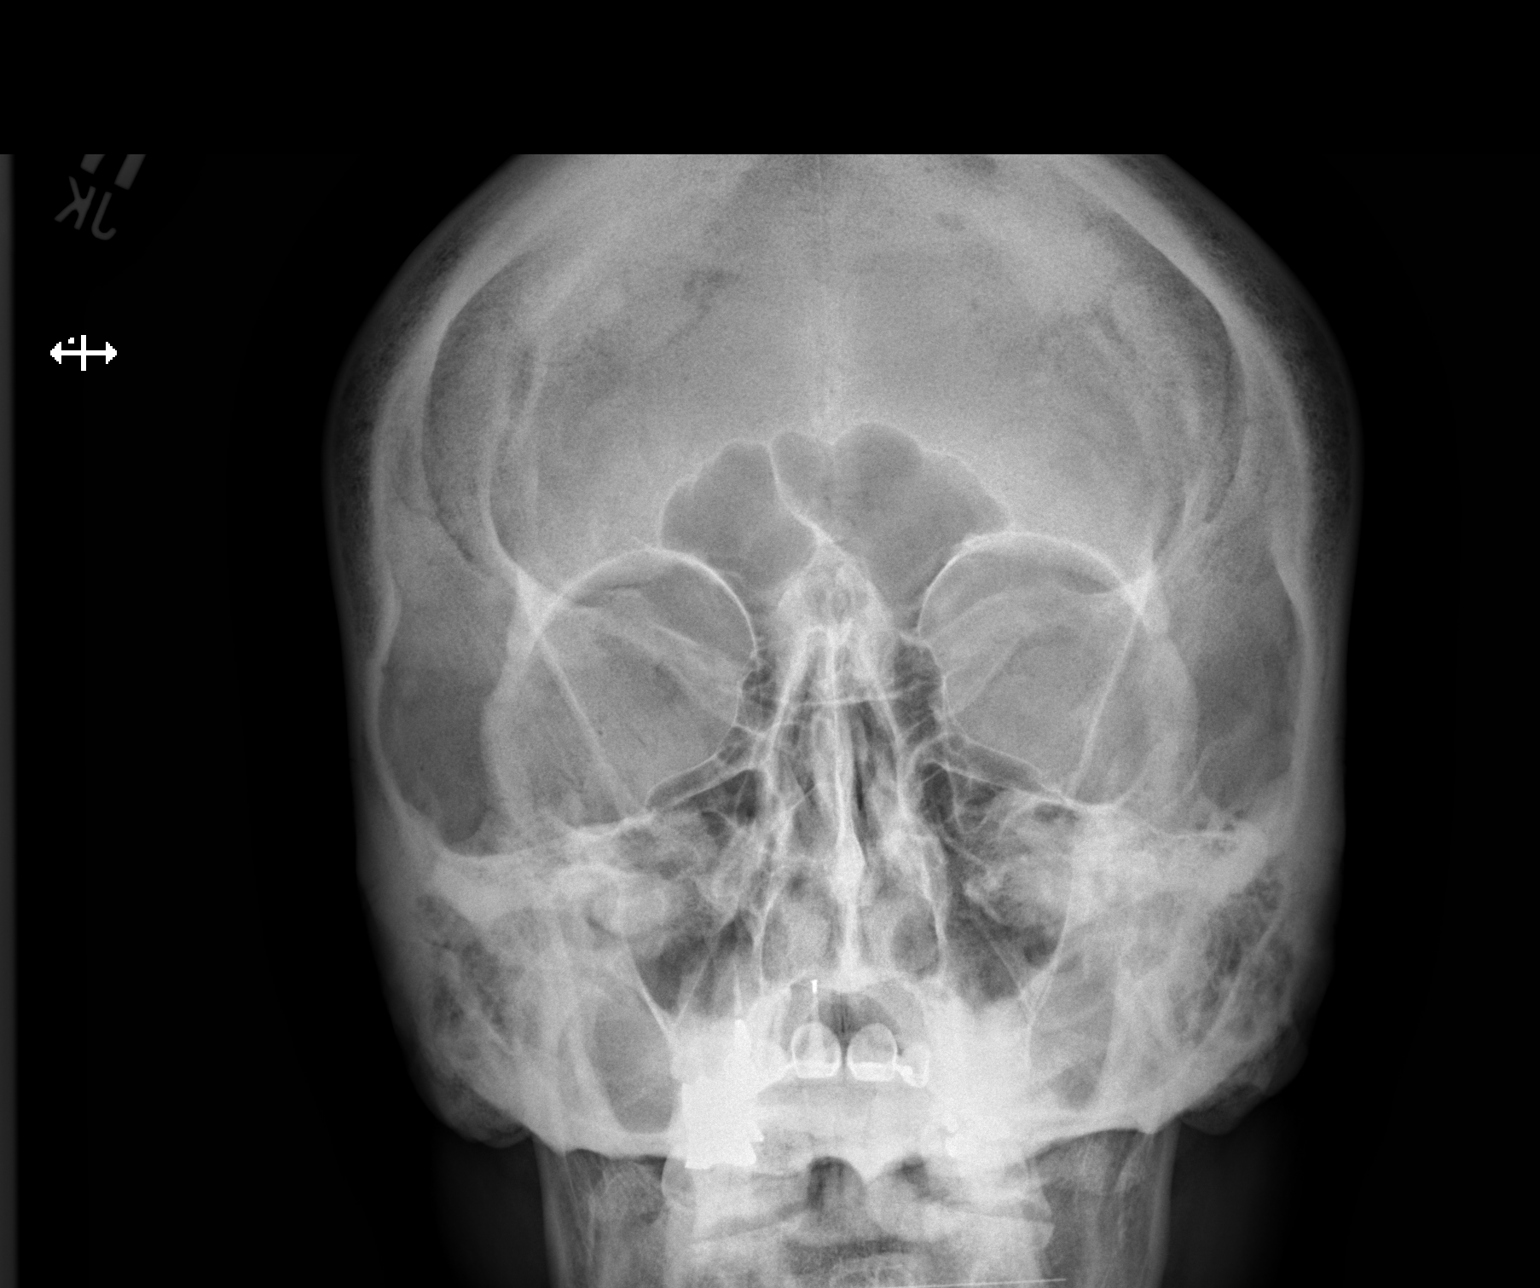

[2 of 2 positions shown; findings below may reference images not displayed]

FINDINGS: There is no evidence of metallic foreign body within the orbits. No
significant bone abnormality identified.
IMPRESSION: No evidence of metallic foreign body within the orbits.

## 2020-02-21 IMAGING — MR MR PROSTATE WO/W CM
56 series · 56 of 56 positions shown · IV contrast (multihance)
Comparison: None.

CLINICAL DATA: Elevated PSA.

EXAM:
MR PROSTATE WITHOUT AND WITH CONTRAST
TECHNIQUE: Multiplanar multisequence MRI images were obtained of the pelvis
centered about the prostate. Pre and post contrast images were
obtained.
CONTRAST:  20mL MULTIHANCE GADOBENATE DIMEGLUMINE 529 MG/ML IV SOLN

[Series 3: bSSFP fat-sat · axial · 8.0mm · 0.74mm/px · 1 of 28 slices shown]
[im 1/28]
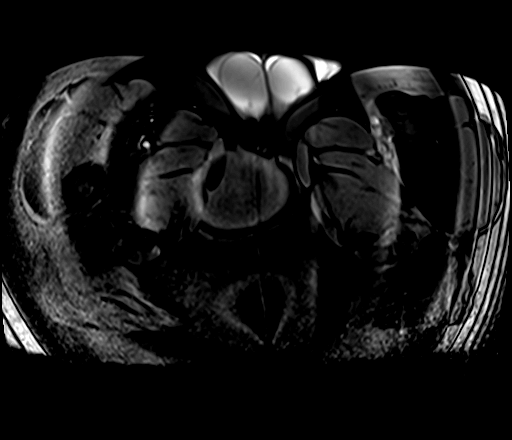

[Series 4: T1 · axial · 5.0mm · 1.25mm/px · 1 of 80 slices shown]
[im 1/80]
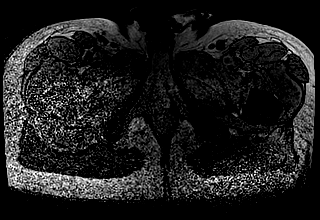

[Series 5: T2 · coronal · 3.5mm · 0.56mm/px · 1 of 23 slices shown (1 of 3)]
[im 1/23]
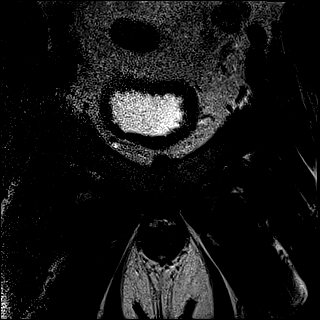

[Series 6: DWI · axial · 3.5mm · 1.75mm/px · 1 of 56 slices shown (1 of 3)]
[im 1/56]
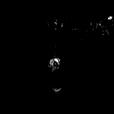

[Series 7: DWI · axial · 3.5mm · 1.75mm/px · 1 of 20 slices shown (2 of 3)]
[im 1/20]
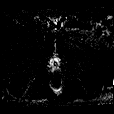

[Series 8: DWI · axial · 3.5mm · 1.56mm/px · 1 of 20 slices shown (3 of 3)]
[im 1/20]
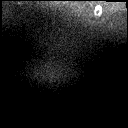

[Series 9: T2 · axial · 3.5mm · 0.56mm/px · 1 of 23 slices shown (2 of 3)]
[im 1/23]
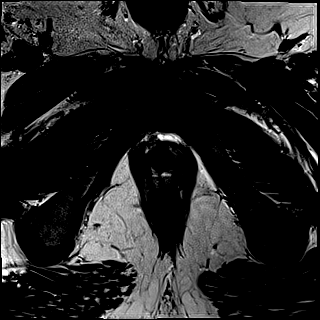

[Series 10: T2 · axial · 1.0mm · 1.04mm/px · 1 of 80 slices shown (3 of 3)]
[im 1/80]
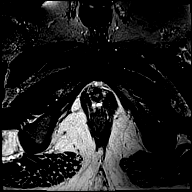

[Series 11: pre t1_twist_tra_dyn_ttc=5.3s · axial · non-contrast · 3.5mm · 0.83mm/px · 1 of 20 slices shown]
[im 1/20]
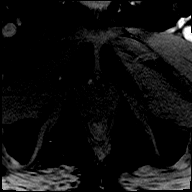

[Series 12: post t1_twist_tra_dyn-copy center · axial · 3.5mm · 0.83mm/px · 1 of 20 slices shown (1 of 24)]
[im 1/20]
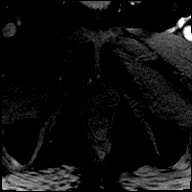

[Series 13: post t1_twist_tra_dyn-copy center · axial · 3.5mm · 0.83mm/px · 1 of 20 slices shown (2 of 24)]
[im 1/20]
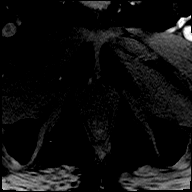

[Series 14: post t1_twist_tra_dyn-copy cent_sub_ttc=(id) · axial · 3.5mm · 0.83mm/px · 1 of 19 slices shown (1 of 23)]
[im 1/19]
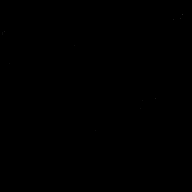

[Series 15: post t1_twist_tra_dyn-copy center · axial · 3.5mm · 0.83mm/px · 1 of 20 slices shown (3 of 24)]
[im 1/20]
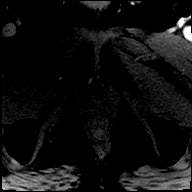

[Series 16: post t1_twist_tra_dyn-copy cent_sub_ttc=(id) · axial · 3.5mm · 0.83mm/px · 1 of 18 slices shown (2 of 23)]
[im 1/18]
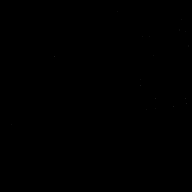

[Series 17: post t1_twist_tra_dyn-copy center · axial · 3.5mm · 0.83mm/px · 1 of 20 slices shown (4 of 24)]
[im 1/20]
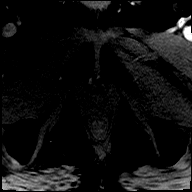

[Series 18: post t1_twist_tra_dyn-copy cent_sub_ttc=(id) · axial · 3.5mm · 0.83mm/px · 1 of 20 slices shown (3 of 23)]
[im 1/20]
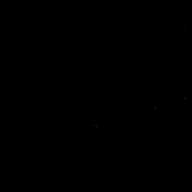

[Series 19: post t1_twist_tra_dyn-copy center · axial · 3.5mm · 0.83mm/px · 1 of 20 slices shown (5 of 24)]
[im 1/20]
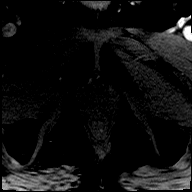

[Series 20: post t1_twist_tra_dyn-copy cent_sub_ttc=(id) · axial · 3.5mm · 0.83mm/px · 1 of 19 slices shown (4 of 23)]
[im 1/19]
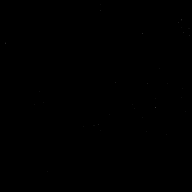

[Series 21: post t1_twist_tra_dyn-copy center · axial · 3.5mm · 0.83mm/px · 1 of 20 slices shown (6 of 24)]
[im 1/20]
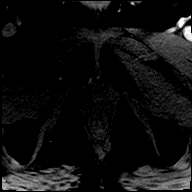

[Series 22: post t1_twist_tra_dyn-copy cent_sub_ttc=(id) · axial · 3.5mm · 0.83mm/px · 1 of 20 slices shown (5 of 23)]
[im 1/20]
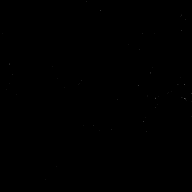

[Series 23: post t1_twist_tra_dyn-copy center · axial · 3.5mm · 0.83mm/px · 1 of 20 slices shown (7 of 24)]
[im 1/20]
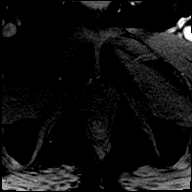

[Series 24: post t1_twist_tra_dyn-copy cent_sub_ttc=(id) · axial · 3.5mm · 0.83mm/px · 1 of 20 slices shown (6 of 23)]
[im 1/20]
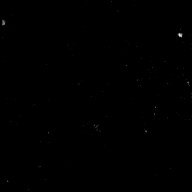

[Series 25: post t1_twist_tra_dyn-copy center · axial · 3.5mm · 0.83mm/px · 1 of 20 slices shown (8 of 24)]
[im 1/20]
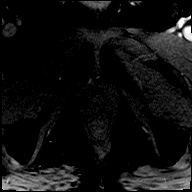

[Series 26: post t1_twist_tra_dyn-copy cent_sub_ttc=(id) · axial · 3.5mm · 0.83mm/px · 1 of 20 slices shown (7 of 23)]
[im 1/20]
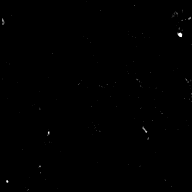

[Series 27: post t1_twist_tra_dyn-copy center · axial · 3.5mm · 0.83mm/px · 1 of 20 slices shown (9 of 24)]
[im 1/20]
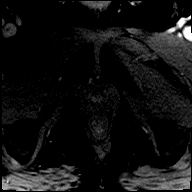

[Series 28: post t1_twist_tra_dyn-copy cent_sub_ttc=(id) · axial · 3.5mm · 0.83mm/px · 1 of 20 slices shown (8 of 23)]
[im 1/20]
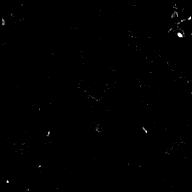

[Series 29: post t1_twist_tra_dyn-copy center · axial · 3.5mm · 0.83mm/px · 1 of 20 slices shown (10 of 24)]
[im 1/20]
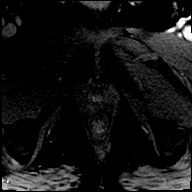

[Series 30: post t1_twist_tra_dyn-copy cent_sub_ttc=(id) · axial · 3.5mm · 0.83mm/px · 1 of 20 slices shown (9 of 23)]
[im 1/20]
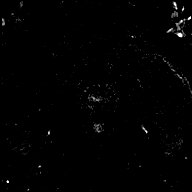

[Series 31: post t1_twist_tra_dyn-copy center · axial · 3.5mm · 0.83mm/px · 1 of 20 slices shown (11 of 24)]
[im 1/20]
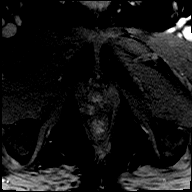

[Series 32: post t1_twist_tra_dyn-copy cent_sub_ttc=(id) · axial · 3.5mm · 0.83mm/px · 1 of 20 slices shown (10 of 23)]
[im 1/20]
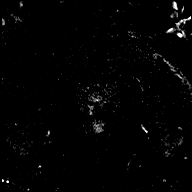

[Series 33: post t1_twist_tra_dyn-copy center · axial · 3.5mm · 0.83mm/px · 1 of 20 slices shown (12 of 24)]
[im 1/20]
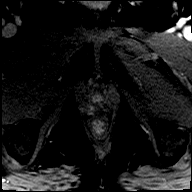

[Series 34: post t1_twist_tra_dyn-copy cent_sub_ttc=(id) · axial · 3.5mm · 0.83mm/px · 1 of 20 slices shown (11 of 23)]
[im 1/20]
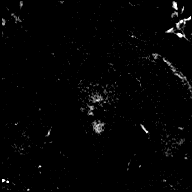

[Series 35: post t1_twist_tra_dyn-copy center · axial · 3.5mm · 0.83mm/px · 1 of 20 slices shown (13 of 24)]
[im 1/20]
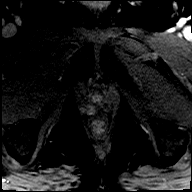

[Series 36: post t1_twist_tra_dyn-copy cent_sub_ttc=(id) · axial · 3.5mm · 0.83mm/px · 1 of 20 slices shown (12 of 23)]
[im 1/20]
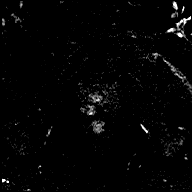

[Series 37: post t1_twist_tra_dyn-copy center · axial · 3.5mm · 0.83mm/px · 1 of 20 slices shown (14 of 24)]
[im 1/20]
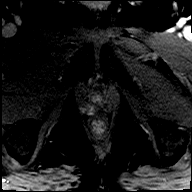

[Series 38: post t1_twist_tra_dyn-copy cent_sub_ttc=(id) · axial · 3.5mm · 0.83mm/px · 1 of 20 slices shown (13 of 23)]
[im 1/20]
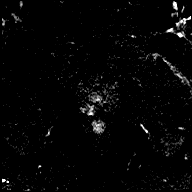

[Series 39: post t1_twist_tra_dyn-copy center · axial · 3.5mm · 0.83mm/px · 1 of 20 slices shown (15 of 24)]
[im 1/20]
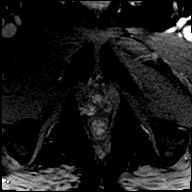

[Series 40: post t1_twist_tra_dyn-copy cent_sub_ttc=(id) · axial · 3.5mm · 0.83mm/px · 1 of 20 slices shown (14 of 23)]
[im 1/20]
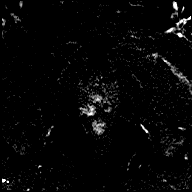

[Series 41: post t1_twist_tra_dyn-copy center · axial · 3.5mm · 0.83mm/px · 1 of 20 slices shown (16 of 24)]
[im 1/20]
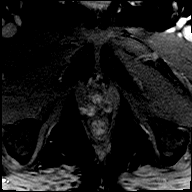

[Series 42: post t1_twist_tra_dyn-copy cent_sub_ttc=(id) · axial · 3.5mm · 0.83mm/px · 1 of 20 slices shown (15 of 23)]
[im 1/20]
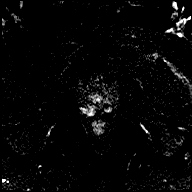

[Series 43: post t1_twist_tra_dyn-copy center · axial · 3.5mm · 0.83mm/px · 1 of 20 slices shown (17 of 24)]
[im 1/20]
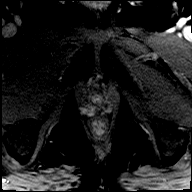

[Series 44: post t1_twist_tra_dyn-copy cent_sub_ttc=(id) · axial · 3.5mm · 0.83mm/px · 1 of 20 slices shown (16 of 23)]
[im 1/20]
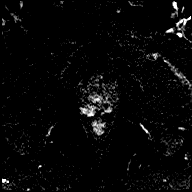

[Series 45: post t1_twist_tra_dyn-copy center · axial · 3.5mm · 0.83mm/px · 1 of 20 slices shown (18 of 24)]
[im 1/20]
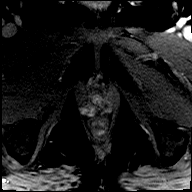

[Series 46: post t1_twist_tra_dyn-copy cent_sub_ttc=(id) · axial · 3.5mm · 0.83mm/px · 1 of 20 slices shown (17 of 23)]
[im 1/20]
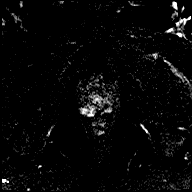

[Series 47: post t1_twist_tra_dyn-copy center · axial · 3.5mm · 0.83mm/px · 1 of 20 slices shown (19 of 24)]
[im 1/20]
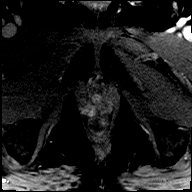

[Series 48: post t1_twist_tra_dyn-copy cent_sub_ttc=(id) · axial · 3.5mm · 0.83mm/px · 1 of 20 slices shown (18 of 23)]
[im 1/20]
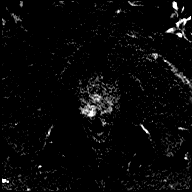

[Series 49: post t1_twist_tra_dyn-copy center · axial · 3.5mm · 0.83mm/px · 1 of 20 slices shown (20 of 24)]
[im 1/20]
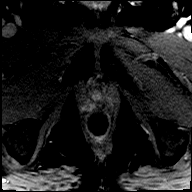

[Series 50: post t1_twist_tra_dyn-copy cent_sub_ttc=(id) · axial · 3.5mm · 0.83mm/px · 1 of 20 slices shown (19 of 23)]
[im 1/20]
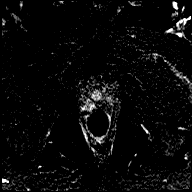

[Series 51: post t1_twist_tra_dyn-copy center · axial · 3.5mm · 0.83mm/px · 1 of 20 slices shown (21 of 24)]
[im 1/20]
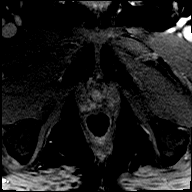

[Series 52: post t1_twist_tra_dyn-copy cent_sub_ttc=(id) · axial · 3.5mm · 0.83mm/px · 1 of 20 slices shown (20 of 23)]
[im 1/20]
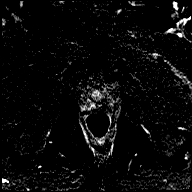

[Series 53: post t1_twist_tra_dyn-copy center · axial · 3.5mm · 0.83mm/px · 1 of 20 slices shown (22 of 24)]
[im 1/20]
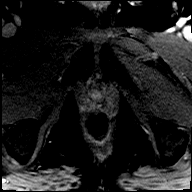

[Series 54: post t1_twist_tra_dyn-copy cent_sub_ttc=(id) · axial · 3.5mm · 0.83mm/px · 1 of 20 slices shown (21 of 23)]
[im 1/20]
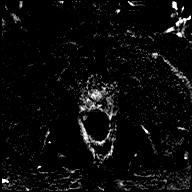

[Series 55: post t1_twist_tra_dyn-copy center · axial · 3.5mm · 0.83mm/px · 1 of 20 slices shown (23 of 24)]
[im 1/20]
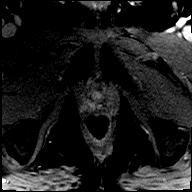

[Series 56: post t1_twist_tra_dyn-copy cent_sub_ttc=(id) · axial · 3.5mm · 0.83mm/px · 1 of 20 slices shown (22 of 23)]
[im 1/20]
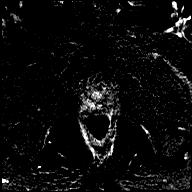

[Series 57: post t1_twist_tra_dyn-copy center · axial · 3.5mm · 0.83mm/px · 1 of 20 slices shown (24 of 24)]
[im 1/20]
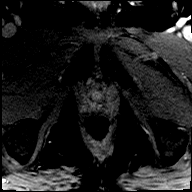

[Series 58: post t1_twist_tra_dyn-copy cent_sub_ttc=(id) · axial · 3.5mm · 0.83mm/px · 1 of 20 slices shown (23 of 23)]
[im 1/20]
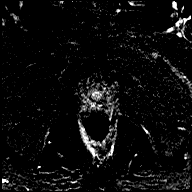

[56 of 56 positions shown; findings below may reference images not displayed]

FINDINGS: Prostate:

-- Peripheral Zone: No abnormality seen on ADC and high b-value DWI
sequences.

-- Transition/Central Zone:  Normal appearance.

-- Measurements/Volume:  5.3 x 4.3 x 5.4 cm (volume = 64 cm^3)

Transcapsular spread:  Absent

Seminal vesicle involvement:  Absent

Neurovascular bundle involvement:  Absent

Pelvic adenopathy: None visualized

Bone metastasis: None visualized

Other:  None
IMPRESSION: No radiographic evidence of high-grade prostate carcinoma. PI-RADS
1: Very Low (clinically significant cancer is highly unlikely to be
present).

## 2020-02-21 MED ORDER — GADOBENATE DIMEGLUMINE 529 MG/ML IV SOLN
20.0000 mL | Freq: Once | INTRAVENOUS | Status: AC | PRN
  Administered 2020-02-21: 20 mL via INTRAVENOUS

## 2020-07-02 ENCOUNTER — Other Ambulatory Visit: Payer: Self-pay

## 2020-07-02 ENCOUNTER — Ambulatory Visit
Admission: RE | Admit: 2020-07-02 | Discharge: 2020-07-02 | Disposition: A | Discharge status: Home / Self Care | Payer: Managed Care, Other (non HMO) | Source: Ambulatory Visit | Attending: Urology | Admitting: Urology

## 2020-07-02 DIAGNOSIS — N281 Cyst of kidney, acquired: Secondary | ICD-10-CM

## 2020-07-02 IMAGING — MR MR ABDOMEN WO/W CM
15 series · 48 of 48 positions shown · IV contrast (multihance)
Comparison: Multiple exams, including [DATE] and CT scan from
[DATE]

CLINICAL DATA: Intermittent hematuria for 2 years. Bosniak category
IIF left renal lesion surveillance

EXAM:
MRI ABDOMEN WITHOUT AND WITH CONTRAST
TECHNIQUE: Multiplanar multisequence MR imaging of the abdomen was performed
both before and after the administration of intravenous contrast.
CONTRAST:  20mL MULTIHANCE GADOBENATE DIMEGLUMINE 529 MG/ML IV SOLN

[Series 3: T2 · coronal · 5.0mm · 1.56mm/px · 3 of 36 slices shown (1 of 3)]
[im 1/36]
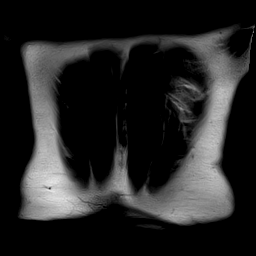
[im 18/36]
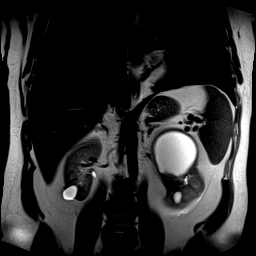
[im 36/36]
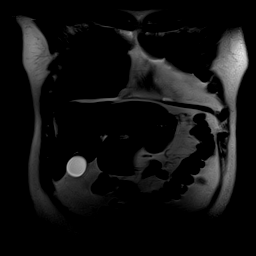

[Series 4: T1 · axial · 3.0mm · 1.19mm/px · z∈[-129,+84]mm · 7 of 144 slices shown]
[im 1/144]
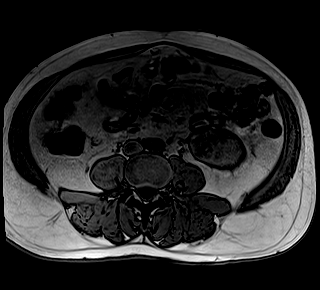
[im 24/144]
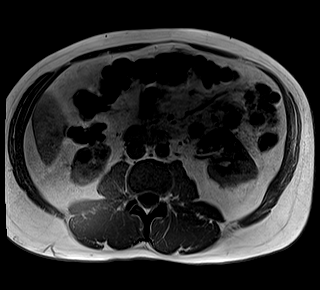
[im 48/144]
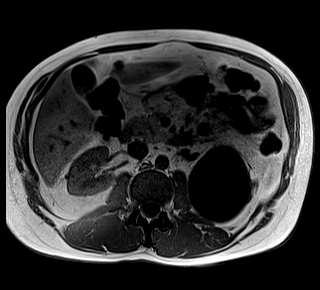
[im 72/144]
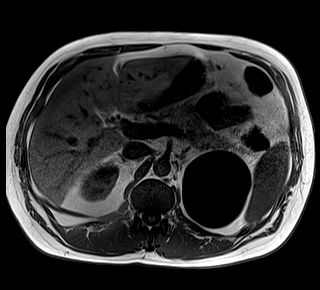
[im 96/144]
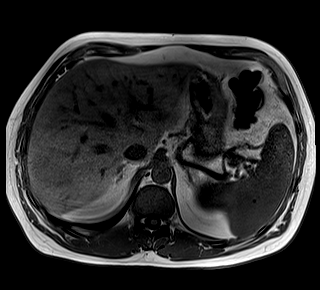
[im 120/144]
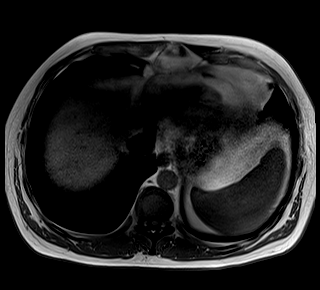
[im 144/144]
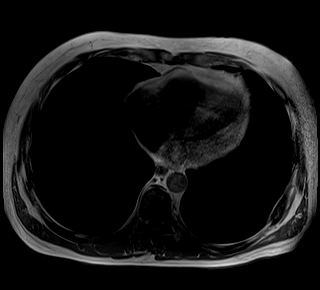

[Series 5: T2 · axial · 5.0mm · 1.48mm/px · z∈[-150,+72]mm · 3 of 38 slices shown (2 of 3)]
[im 1/38]
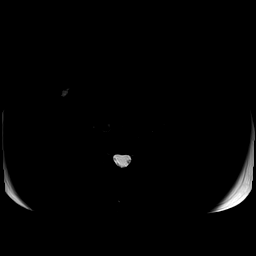
[im 19/38]
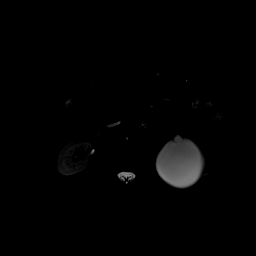
[im 38/38]
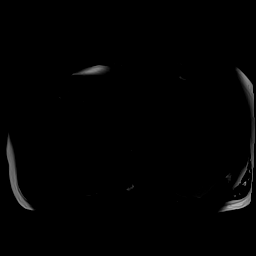

[Series 6: DWI · axial · 5.0mm · 1.42mm/px · z∈[-162,+84]mm · 5 of 126 slices shown (1 of 2)]
[im 1/126]
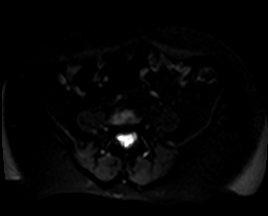
[im 32/126]
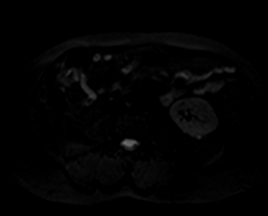
[im 63/126]
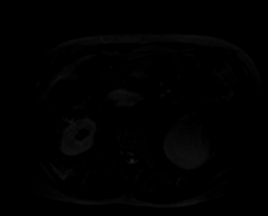
[im 94/126]
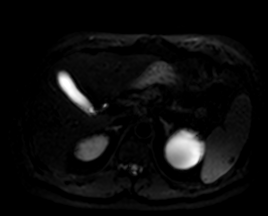
[im 126/126]
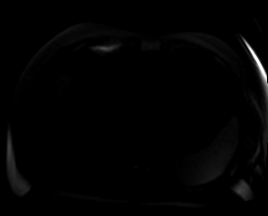

[Series 7: DWI · axial · 5.0mm · 1.42mm/px · z∈[-162,+84]mm · 2 of 42 slices shown (2 of 2)]
[im 1/42]
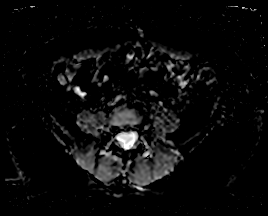
[im 42/42]
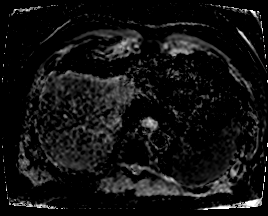

[Series 8: T2 · axial · 6.0mm · 1.22mm/px · z∈[-179,+102]mm · 2 of 40 slices shown (3 of 3)]
[im 1/40]
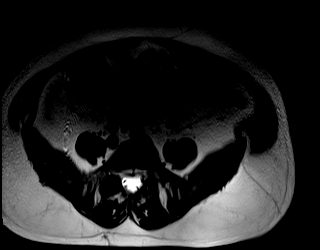
[im 40/40]
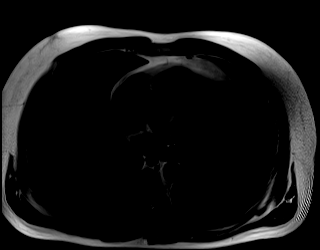

[Series 9: bSSFP · axial · 5.0mm · 1.25mm/px · z∈[-165,+93]mm · 2 of 44 slices shown]
[im 1/44]
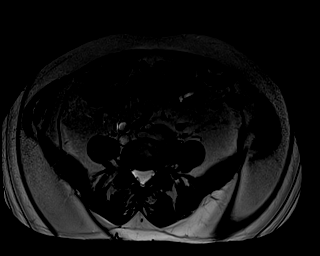
[im 44/44]
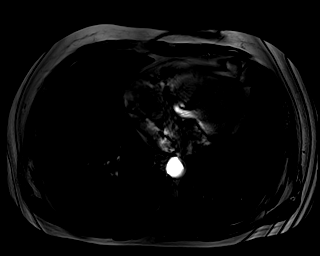

[Series 10: T1 dynamic · axial · non-contrast · 3.0mm · 1.25mm/px · z∈[-164,+73]mm · 3 of 80 slices shown]
[im 1/80]
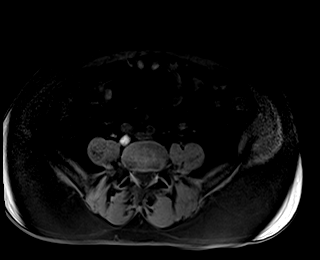
[im 40/80]
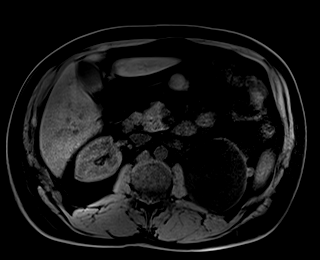
[im 80/80]
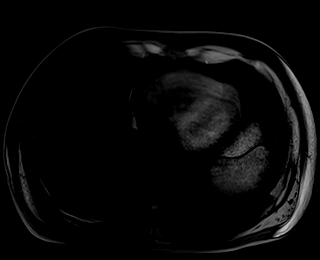

[Series 11: T1 dynamic post-contrast · axial · 3.0mm · 1.25mm/px · z∈[-164,+73]mm · 3 of 80 slices shown (1 of 7)]
[im 1/80]
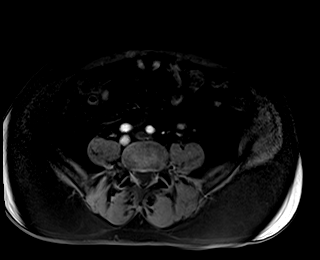
[im 40/80]
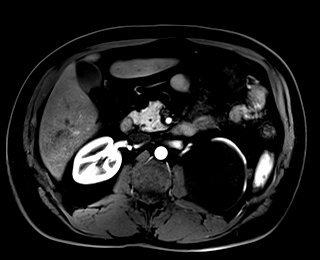
[im 80/80]
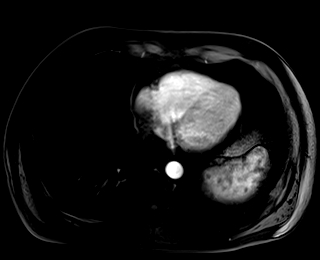

[Series 12: T1 dynamic post-contrast · axial · 3.0mm · 1.25mm/px · z∈[-164,+73]mm · 3 of 80 slices shown (2 of 7)]
[im 1/80]
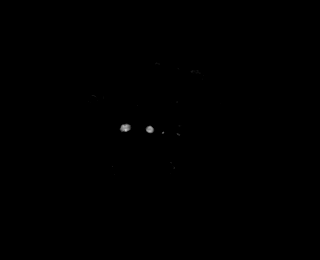
[im 40/80]
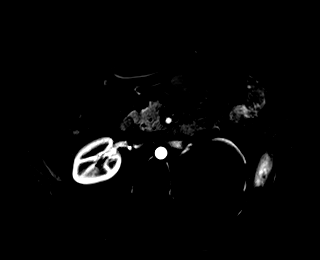
[im 80/80]
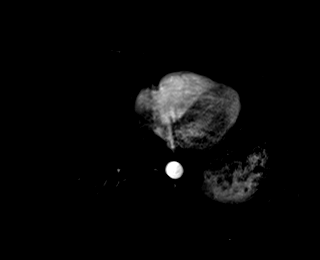

[Series 13: T1 dynamic post-contrast · axial · 3.0mm · 1.25mm/px · z∈[-164,+73]mm · 3 of 80 slices shown (3 of 7)]
[im 1/80]
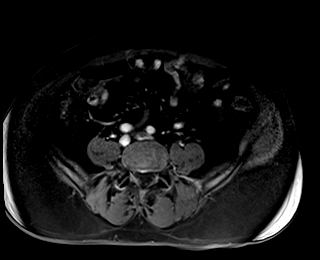
[im 40/80]
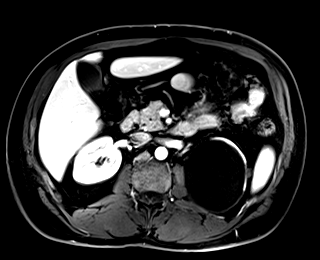
[im 80/80]
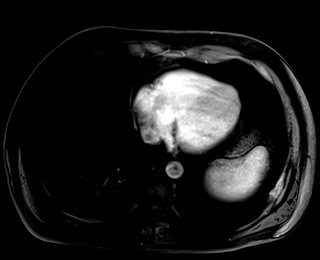

[Series 14: T1 dynamic post-contrast · axial · 3.0mm · 1.25mm/px · z∈[-164,+73]mm · 3 of 80 slices shown (4 of 7)]
[im 1/80]
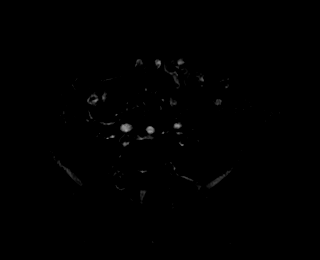
[im 40/80]
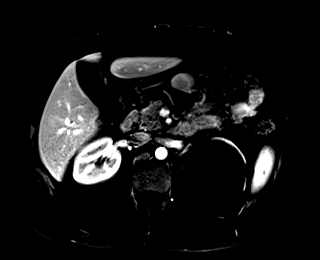
[im 80/80]
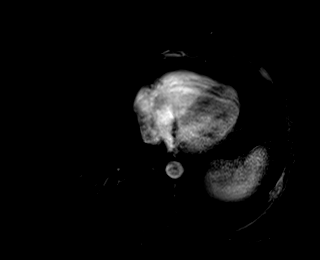

[Series 15: T1 dynamic post-contrast · axial · 3.0mm · 1.25mm/px · z∈[-164,+73]mm · 3 of 80 slices shown (5 of 7)]
[im 1/80]
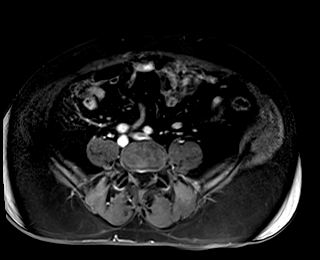
[im 40/80]
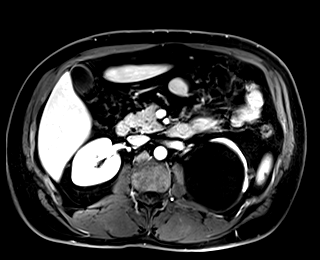
[im 80/80]
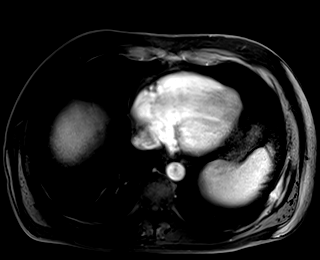

[Series 16: T1 dynamic post-contrast · axial · 3.0mm · 1.25mm/px · z∈[-164,+73]mm · 3 of 80 slices shown (6 of 7)]
[im 1/80]
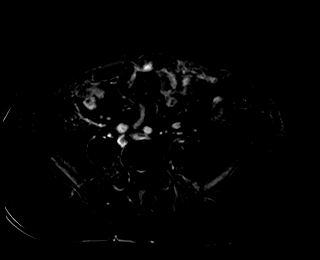
[im 40/80]
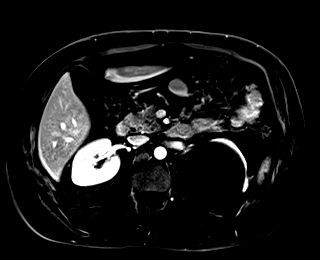
[im 80/80]
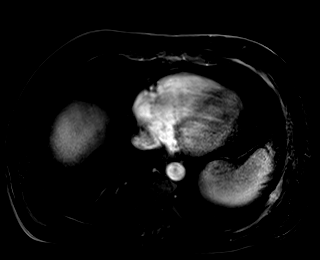

[Series 17: T1 dynamic post-contrast · coronal · 3.0mm · 1.25mm/px · 3 of 72 slices shown (7 of 7)]
[im 1/72]
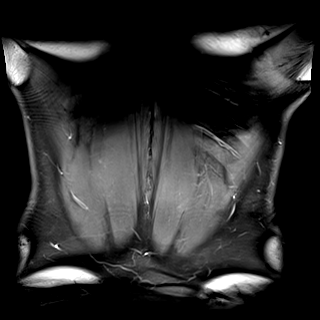
[im 36/72]
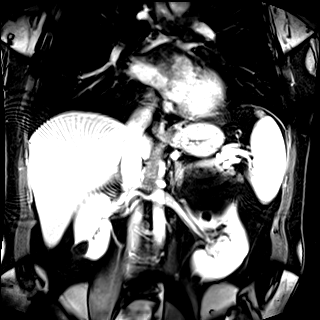
[im 72/72]
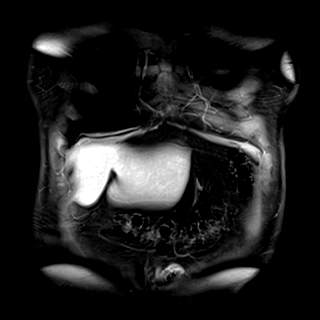

[48 of 48 positions shown; findings below may reference images not displayed]

FINDINGS: Despite efforts by the technologist and patient, motion artifact is
present on today's exam and could not be eliminated. This reduces
exam sensitivity and specificity.

Lower chest: Unremarkable

Hepatobiliary: Diffuse hepatic steatosis.  Gallbladder unremarkable.

Questionable 3 mm arterial phase enhancing lesion in the dome of the
left hepatic lobe on image 10 of series 11, possibly artifactual
given the tiny size and lack of conspicuity on other sequences. I am
skeptical that this is a significant lesion.

Pancreas:  Unremarkable

Spleen: No significant lesion. Scattered splenic calcifications
favoring old granulomatous disease.

Adrenals/Urinary Tract: Both adrenal glands appear normal. Several
Bosniak category 1 renal cysts are present.

Laterally in the left kidney upper pole on image 43 of series 10, a
1.1 by 0.9 cm lesion with high precontrast T1 signal characteristics
does not enhance. This was previously considered a Bosniak category
IIF lesion but currently is downgraded to Bosniak category 2 given
the lack of any real appreciable enhancement. No change in size of
this lesion back through [DATE]. A specific cause for hematuria
is not identified.

Stomach/Bowel: Scattered colonic diverticulosis.

Vascular/Lymphatic:  Unremarkable

Other: In the right retro renal space on image 22 of series 5, a
by 1.0 cm T2 hyperintense lesion has intermediate T1 signal and does
not enhance. This lesion is not changed from [DATE] and probably
represents a small incidental cyst of the retroperitoneum or a small
ganglion cyst.

Musculoskeletal: Chronic left pars defect at L5 with spina bifida
occulta.

Chronic left eighth rib lesion posterolaterally, lack of progression
from [DATE] favors a benign etiology.
IMPRESSION: 1. The small lesion of concern in the left kidney upper pole is
stable in size compared to [DATE]. The lack of any real
appreciable enhancement on today's exam indicates a benign Bosniak
category 2 lesion. This lesion is not felt to require further follow
up.
2. Several Bosniak category 1 renal cysts are present bilaterally.
3. Diffuse hepatic steatosis.
4. Chronic left pars defect at L5 with L5 spina bifida occulta.
5. Chronic left eighth rib lesion posterolaterally, lack of
progression from [DATE] favors a benign etiology.
6. Cyst of the right retroperitoneum in the right retro renal space,
versus small ganglion cyst of the retroperitoneum.
7. scattered colonic diverticulosis.
8. Despite efforts by the technologist and patient, motion artifact
is present on today's exam and could not be eliminated. This reduces
exam sensitivity and specificity.

## 2020-07-02 MED ORDER — GADOBENATE DIMEGLUMINE 529 MG/ML IV SOLN
20.0000 mL | Freq: Once | INTRAVENOUS | Status: AC | PRN
  Administered 2020-07-02: 20 mL via INTRAVENOUS

## 2020-09-18 ENCOUNTER — Ambulatory Visit: Payer: Managed Care, Other (non HMO) | Admitting: General Surgery

## 2020-10-09 ENCOUNTER — Encounter: Payer: Self-pay | Admitting: General Surgery

## 2020-10-09 ENCOUNTER — Ambulatory Visit (INDEPENDENT_AMBULATORY_CARE_PROVIDER_SITE_OTHER): Payer: Managed Care, Other (non HMO) | Admitting: General Surgery

## 2020-10-09 ENCOUNTER — Other Ambulatory Visit: Payer: Self-pay

## 2020-10-09 VITALS — BP 129/87 | HR 63 | Temp 97.6°F | Resp 14 | Ht 74.0 in | Wt 244.0 lb

## 2020-10-09 DIAGNOSIS — K4090 Unilateral inguinal hernia, without obstruction or gangrene, not specified as recurrent: Secondary | ICD-10-CM | POA: Diagnosis not present

## 2020-10-09 NOTE — Patient Instructions (Addendum)
Let us know when you want to get it scheduled. Get [place] a copy of the [place] report for your carotids.   Inguinal Hernia, Adult An inguinal hernia is when fat or your intestines push through a weak spot in a muscle where your leg meets your lower belly (groin). This causes a bulge. This kind of hernia could also be:  In your scrotum, if you are male.  In folds of skin around your vagina, if you are male. There are three types of inguinal hernias:  Hernias that can be pushed back into the belly (are reducible). This type rarely causes pain.  Hernias that cannot be pushed back into the belly (are incarcerated).  Hernias that cannot be pushed back into the belly and lose their blood supply (are strangulated). This type needs emergency surgery. What are the causes? This condition is caused by having a weak spot in the muscles or tissues in your groin. This develops over time. The hernia may poke through the weak spot when you strain your lower belly muscles all of a sudden, such as when you:  Lift a heavy object.  Strain to poop (have a bowel movement). Trouble pooping (constipation) can lead to straining.  Cough. What increases the risk? This condition is more likely to develop in:  Males.  Pregnant females.  People who: ? Are overweight. ? Work in jobs that require long periods of standing or heavy lifting. ? Have had an inguinal hernia before. ? Smoke or have lung disease. These factors can lead to long-term (chronic) coughing. What are the signs or symptoms? Symptoms may depend on the size of the hernia. Often, a small hernia has no symptoms. Symptoms of a larger hernia may include:  A bulge in the groin area. This is easier to see when standing. You might not be able to see it when you are lying down.  Pain or burning in the groin. This may get worse when you lift, strain, or cough.  A dull ache or a feeling of pressure in the groin.  An abnormal bulge in the scrotum, in  males. Symptoms of a strangulated inguinal hernia may include:  A bulge in your groin that is very painful and tender to the touch.  A bulge that turns red or purple.  Fever, feeling like you may vomit (nausea), and vomiting.  Not being able to poop or to pass gas. How is this treated? Treatment depends on the size of your hernia and whether you have symptoms. If you do not have symptoms, your doctor may have you watch your hernia carefully and have you come in for follow-up visits. If your hernia is large or if you have symptoms, you may need surgery to repair the hernia. Follow these instructions at home: Lifestyle  Avoid lifting heavy objects.  Avoid standing for long amounts of time.  Do not smoke or use any products that contain nicotine or tobacco. If you need help quitting, ask your doctor.  Stay at a healthy weight. Prevent trouble pooping You may need to take these actions to prevent or treat trouble pooping:  Drink enough fluid to keep your pee (urine) pale yellow.  Take over-the-counter or prescription medicines.  Eat foods that are high in fiber. These include beans, whole grains, and fresh fruits and vegetables.  Limit foods that are high in fat and sugar. These include fried or sweet foods. General instructions  You may try to push your hernia back in place by very gently  pressing on it when you are lying down. Do not try to push the bulge back in if it will not go in easily.  Watch your hernia for any changes in shape, size, or color. Tell your doctor if you see any changes.  Take over-the-counter and prescription medicines only as told by your doctor.  Keep all follow-up visits. Contact a doctor if:  You have a fever or chills.  You have new symptoms.  Your symptoms get worse. Get help right away if:  You have pain in your groin that gets worse all of a sudden.  You have a bulge in your groin that: ? Gets bigger all of a sudden, and it does not get  smaller after that. ? Turns red or purple. ? Is painful when you touch it.  You are a male, and you have: ? Sudden pain in your scrotum. ? A sudden change in the size of your scrotum.  You cannot push the hernia back in place by very gently pressing on it when you are lying down.  You feel like you may vomit, and that feeling does not go away.  You keep vomiting.  You have a fast heartbeat.  You cannot poop or pass gas. These symptoms may be an emergency. Get help right away. Call your local emergency services (911 in the [place]  Do not wait to see if the symptoms will go away.  Do not drive yourself to the hospital. Summary  An inguinal hernia is when fat or your intestines push through a weak spot in a muscle where your leg meets your lower belly (groin). This causes a bulge.  If you do not have symptoms, you may not need treatment. If you have symptoms or a large hernia, you may need surgery.  Avoid lifting heavy objects. Also, avoid standing for long amounts of time.  Do not try to push the bulge back in if it will not go in easily. This information is not intended to replace advice given to you by your health care provider. Make sure you discuss any questions you have with your health care provider. Document Revised: 04/03/2020 Document Reviewed: 04/03/2020 Elsevier Patient Education  2021 [employer] Repair, Adult Open hernia repair is a surgical procedure to fix a hernia. A hernia occurs when an internal organ or tissue pushes through a weak spot in the muscles along the wall of the abdomen. Hernias commonly occur in the groin and around the belly button. Most hernias tend to get worse over time. Often, surgery is done to prevent the hernia from becoming bigger, uncomfortable, or an emergency. Emergency surgery may be needed if contents of the abdomen get stuck in the opening (incarcerated hernia) or if the blood supply gets cut off (strangulated hernia).  In an open repair, an incision is made in the abdomen to perform the surgery. Tell a health care provider about:  Any allergies you have.  All medicines you are taking, including vitamins, herbs, eye drops, creams, and over-the-counter medicines.  Any problems you or family members have had with anesthetic medicines.  Any blood or bone disorders you have.  Any surgeries you have had.  Any medical conditions you have, including any recent cold or flu (influenza)symptoms.  Whether you are pregnant or may be pregnant. What are the risks? Generally, this is a safe procedure. However, problems may occur, including:  Long-lasting (chronic) pain.  Bleeding.  Infection.  Damage to the testicles. This can  cause shrinking or swelling.  Damage to nearby structures or organs, including the bladder, blood vessels, intestines, or nerves near the hernia.  Blood clots.  Trouble passing urine.  Return of the hernia. Medicines Ask your health care provider about:  Changing or stopping your regular medicines. This is especially important if you are taking diabetes medicines or blood thinners.  Taking medicines such as aspirin and ibuprofen. These medicines can thin your blood. Do not take these medicines unless your health care provider tells you to take them.  Taking over-the-counter medicines, vitamins, herbs, and supplements. Surgery safety Ask your health care provider:  How your surgery site will be marked.  What steps will be taken to help prevent infection. These steps may include: ? Removing hair at the surgery site. ? Washing skin with a germ-killing soap. ? Receiving antibiotic medicine. General instructions  You may have an exam or testing, such as blood tests or imaging studies.  Do not use any products that contain nicotine or tobacco for at least 4 weeks before the procedure. These products include cigarettes, chewing tobacco, and vaping devices, such as  e-cigarettes. If you need help quitting, ask your health care provider.  Let your health care provider know if you develop a cold or any infection before your surgery. If you get an infection before surgery, you may receive antibiotics to treat it.  Plan to have a responsible adult take you home from the hospital or clinic.  If you will be going home right after the procedure, plan to have a responsible adult care for you for the time you are told. This is important. What happens during the procedure?  An IV will be inserted into one of your veins.  You will be given one or more of the following: ? A medicine to help you relax (sedative). ? A medicine to numb the area (local anesthetic). ? A medicine to make you fall asleep (general anesthetic).  Your surgeon will make an incision over the hernia.  The tissues of the hernia will be moved back into place.  The edges of the hernia may be stitched (sutured) together.  The opening in the abdominal muscles will be closed with stitches (sutures). Or, your surgeon will place a mesh patch made of artificial (synthetic) material over the opening.  The incision will be closed with sutures, skin glue, or adhesive strips.  A bandage (dressing) may be placed over the incision. The procedure may vary among health care providers and hospitals.   What happens after the procedure?  Your blood pressure, heart rate, breathing rate, and blood oxygen level will be monitored until you leave the hospital or clinic.  You may be given medicine for pain.  If you were given a sedative during the procedure, it can affect you for several hours. Do not drive or operate machinery until your health care provider says that it is safe. Summary  Open hernia repair is a surgical procedure to fix a hernia. Hernias commonly occur in the groin and around the belly button.  Emergency surgery may be needed if contents of the abdomen get stuck in the opening  (incarcerated hernia) or if the blood supply gets cut off (strangulated hernia).  In this procedure, an incision is made in the abdomen to perform the surgery.  After the procedure, you may be given medicine for pain. This information is not intended to replace advice given to you by your health care provider. Make sure you discuss any questions  you have with your health care provider. Document Revised: 03/19/2020 Document Reviewed: 03/19/2020 Elsevier Patient Education  2021 [employer].

## 2020-10-09 NOTE — Progress Notes (Signed)
[facility] History and Physical  Reason for Referral: Right inguinal hernia  Referring Physician:  Self referral   Chief Complaint    Hernia      Andrew Ryan is a 55 y.o. male.  HPI:  Andrew Ryan is a 55 yo who is relatively healthy and takes minimal medications but has noticed a right inguinal discomfort that comes and goes and has become more pronounced in the last few weeks. He has had a prior umbilical and left inguinal hernia repair in the past.  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 This was managed medically and he followed with vascular but is only on a baby aspirin and statin. He has annual carotid US at work and is due this week.  He denies any obstructive type symptoms or other cardiac symptoms or events.   Past Medical History:  Diagnosis Date  . Atypical nevus 05/19/2008   Right Medial Lower Abdomen-Moderate (Dr. Waldon Merl) and Right Lateral Mid Abdomen-Moderate to Marked (Dr. Waldon Merl)  . Atypical nevus 05/28/2010   Right Lateral Abd-Moderate, Right Upper Medial Arm-Mild and Left Lateral Abdomen-Mild (Dr. Waldon Merl)  . BCC (basal cell carcinoma of skin) 05/19/2008   Left Medial Shoulder (Dr. Waldon Merl)  . BCC (basal cell carcinoma of skin) 02/03/2011   Above Right Ear [facility])  . BCC (basal cell carcinoma of skin) 06/30/2011   Left Upper Back (Cx3,5FU) and Upper Mid Back (Cx3,5FU)  . BCC (basal cell carcinoma of skin) 03/15/2014   Right Forearm (tx p bx)  . GERD (gastroesophageal reflux disease)   . H. pylori infection   . Histoplasmosis   . Hx of adenomatous colonic polyps 07/29/2017  . Skin cancer, basal cell   . Squamous cell skin cancer   . Stroke ([place]) 09/2016  . Superficial basal cell carcinoma (BCC) 05/19/2008   Left Lateral Shoulder, and Left Lateral Neck (Dr. Waldon Merl)    Past Surgical History:  Procedure Laterality Date  . HERNIA  REPAIR     x 2 umbilical and inguinal  . KNEE ARTHROSCOPY Bilateral   . UPPER GASTROINTESTINAL ENDOSCOPY      Family History  Problem Relation Age of Onset  . Congestive Heart Failure Father   . COPD Father   . Lung cancer Paternal Grandfather        smoker, Ecologist  . Lung cancer Maternal Grandfather        smoker, Ecologist  . Heart attack Maternal Grandfather   . Colon cancer Neg Hx   . Esophageal cancer Neg Hx   . Stomach cancer Neg Hx     Social History   Tobacco Use  . Smoking status: Never Smoker  . Smokeless tobacco: Never Used  Vaping Use  . Vaping Use: Never used  Substance Use Topics  . Alcohol use: Yes    Alcohol/week: 0.0 standard drinks    Comment: occ  . Drug use: No    Medications: I have reviewed the patient's current medications. Allergies as of 10/09/2020   No Known Allergies     Medication List       Accurate as of October 09, 2020  8:58 AM. If you have any questions, ask your nurse or doctor.        STOP taking these medications   fexofenadine 180 MG tablet Commonly known as: ALLEGRA Stopped by: Virl Cagey, MD   [place]  PO Stopped by: Virl Cagey, MD     TAKE these medications   ASPIRIN 81 PO Take 1 tablet by mouth daily. What changed: Another medication with the same name was removed. Continue taking this medication, and follow the directions you see here. Changed by: Virl Cagey, MD   atorvastatin 20 MG tablet Commonly known as: LIPITOR Take 1 tablet by mouth daily. What changed: Another medication with the same name was removed. Continue taking this medication, and follow the directions you see here. Changed by: Virl Cagey, MD   cetirizine 10 MG tablet Commonly known as: ZYRTEC Take 10 mg by mouth daily.   escitalopram 10 MG tablet Commonly known as: LEXAPRO escitalopram 10 mg tablet  TAKE 1 TABLET BY MOUTH EVERY DAY   omeprazole 40 MG capsule Commonly known as: PRILOSEC Take 1  capsule by mouth daily.        ROS:  A comprehensive review of systems was negative except for: Gastrointestinal: positive for reflux symptoms and right groin discomfort Musculoskeletal: positive for joint pain  Blood pressure 129/87, pulse 63, temperature 97.6 F (36.4 C), temperature source Other (Comment), resp. rate 14, height 6\' 2"  (1.88 m), weight 244 lb (110.7 kg), SpO2 98 %. Physical Exam Vitals reviewed.  Constitutional:      Appearance: He is normal weight.  HENT:     Head: Normocephalic.     Nose: Nose normal.  Eyes:     Extraocular Movements: Extraocular movements intact.  Cardiovascular:     Rate and Rhythm: Normal rate and regular rhythm.  Pulmonary:     Effort: Pulmonary effort is normal.     Breath sounds: Normal breath sounds.  Abdominal:     General: There is no distension.     Palpations: Abdomen is soft.     Tenderness: There is no abdominal tenderness.     Hernia: A hernia is present. Hernia is present in the right inguinal area.     Comments: Small, reducible, minor tenderness   Musculoskeletal:        General: Normal range of motion.     Cervical back: Normal range of motion.  Skin:    General: Skin is warm.  Neurological:     General: No focal deficit present.     Mental Status: He is alert and oriented to person, place, and time.  Psychiatric:        Mood and Affect: Mood normal.        Behavior: Behavior normal.        Thought Content: Thought content normal.     Results: None  Assessment & Plan:  Andrew Ryan is a 55 y.o. male with a small right inguinal hernia that is starting to cause him issues. Discussed repair versus watchful waiting and risk of repair in next 5-10 years.   Discussed the risk and benefits including, bleeding, infection, use of mesh, risk of recurrence, risk of nerve damage causing numbness or changes in sensation, risk of damage to the cord structures. The patient understands the risk and benefits of repair with  mesh, and has decided to proceed.  We also discussed open versus laparoscopic surgery and the use of mesh. We discussed that I do open repairs with mesh, and that this is considered equivalent to laparoscopic surgery. We discussed reasons for opting for laparoscopic surgery including if a bilateral repair is needed or if a patient has a recurrence after an open repair.  Discussed preop COVID testing.   All  questions were answered to the satisfaction of the patient.  He will call once he decides about surgery and timing.    Virl Cagey 10/09/2020, 8:58 AM

## 2020-12-09 ENCOUNTER — Encounter: Payer: Self-pay | Admitting: Internal Medicine

## 2021-02-05 ENCOUNTER — Ambulatory Visit: Payer: Self-pay | Admitting: Physician Assistant

## 2021-02-07 NOTE — Patient Instructions (Signed)
Andrew Ryan  02/07/2021     @PREFPERIOPPHARMACY @   Your procedure is scheduled on  02/13/2021.   Report to [facility] at  [phone]  A.M.   Call this number if you have problems the morning of surgery:  [phone]   Remember:  Do not eat or drink after midnight.     Take these medicines the morning of surgery with A SIP OF WATER                  zyrtec, lexapro, prilosec.     Please brush your teeth.   Do not wear jewelry, make-up or nail polish.  Do not wear lotions, powders, or perfumes, or deodorant.  Do not shave 48 hours prior to surgery.  Men may shave face and neck.  Do not bring valuables to the hospital.  [facility] is not responsible for any belongings or valuables.  Contacts, dentures or bridgework may not be worn into surgery.  Leave your suitcase in the car.  After surgery it may be brought to your room.  For patients admitted to the hospital, discharge time will be determined by your treatment team.  Patients discharged the day of surgery will not be allowed to drive home and must have someone with them for 24 hours.    Special instructions:   DO NOT smoke tobacco or vape for 24 hours before your procedure.  Please read over the following fact sheets that you were given. Coughing and Deep Breathing, Surgical Site Infection Prevention, Anesthesia Post-op Instructions, and Care and Recovery After Surgery      Open Hernia Repair, Adult, Care After What can I expect after the procedure? After the procedure, it is common to have: Mild discomfort. Slight bruising. Mild swelling. Pain in the belly (abdomen). A small amount of blood from the cut from surgery (incision). Follow these instructions at home: Your doctor may give you more specific instructions. If you have problems, callyour doctor. Medicines Take over-the-counter and prescription medicines only as told by your doctor. If told, take steps to prevent problems with pooping  (constipation). You may need to: Drink enough fluid to keep your pee (urine) pale yellow. Take medicines. You will be told what medicines to take. Eat foods that are high in fiber. These include beans, whole grains, and fresh fruits and vegetables. Limit foods that are high in fat and sugar. These include fried or sweet foods. Ask your doctor if you should avoid driving or using machines while you are taking your medicine. Incision care  Follow instructions from your doctor about how to take care of your incision. Make sure you: Wash your hands with soap and water for at least 20 seconds before and after you change your bandage (dressing). If you cannot use soap and water, use hand sanitizer. Change your bandage. Leave stitches or skin glue in place for at least 2 weeks. Leave tape strips alone unless you are told to take them off. You may trim the edges of the tape strips if they curl up. Check your incision every day for signs of infection. Check for: More redness, swelling, or pain. More fluid or blood. Warmth. Pus or a bad smell. Wear loose, soft clothing while your incision heals.  Activity  Rest as told by your doctor. Do not lift anything that is heavier than 10 lb (4.5 kg), or the limit that you are told. Do not play contact sports until your doctor says  that this is safe. If you were given a sedative during your procedure, do not drive or use machines until your doctor says that it is safe. A sedative is a medicine that helps you relax. Return to your normal activities when your doctor says that it is safe.  General instructions Do not take baths, swim, or use a hot tub. Ask your doctor about taking showers or sponge baths. Hold a pillow over your belly when you cough or sneeze. This helps with pain. Do not smoke or use any products that contain nicotine or tobacco. If you need help quitting, ask your doctor. Keep all follow-up visits. Contact a doctor if: You have any of  these signs of infection in or around your incision: More redness, swelling, or pain. More fluid or blood. Warmth. Pus. A bad smell. You have a fever or chills. You have blood in your poop (stool). You have not pooped (had a bowel movement) in 2-3 days. Medicine does not help your pain. Get help right away if: You have chest pain, or you are short of breath. You feel faint or light-headed. You have very bad pain. You vomit and your pain is worse. You have pain, swelling, or redness in a leg. These symptoms may be an emergency. Get help right away. Call your local emergency services (911 in the [place] Do not wait to see if the symptoms will go away. Do not drive yourself to the hospital. Summary After this procedure, it is common to have mild discomfort, slight bruising, and mild swelling. Follow instructions from your doctor about how to take care of your cut from surgery (incision). Check every day for signs of infection. Do not lift heavy objects or play contact sports until your doctor says it is safe. Return to your normal activities as told by your doctor. This information is not intended to replace advice given to you by your health care provider. Make sure you discuss any questions you have with your healthcare provider. Document Revised: 03/19/2020 Document Reviewed: 03/19/2020 Elsevier Patient Education  2022 [place] Anesthesia, Adult, Care After This sheet gives you information about how to care for yourself after your procedure. Your health care provider may also give you more specific instructions. If you have problems or questions, contact your health careprovider. What can I expect after the procedure? After the procedure, the following side effects are common: Pain or discomfort at the IV site. Nausea. Vomiting. Sore throat. Trouble concentrating. Feeling cold or chills. Feeling weak or tired. Sleepiness and fatigue. Soreness and body aches. These  side effects can affect parts of the body that were not involved in surgery. Follow these instructions at home: For the time period you were told by your health care provider:  Rest. Do not participate in activities where you could fall or become injured. Do not drive or use machinery. Do not drink alcohol. Do not take sleeping pills or medicines that cause drowsiness. Do not make important decisions or sign legal documents. Do not take care of children on your own.  Eating and drinking Follow any instructions from your health care provider about eating or drinking restrictions. When you feel hungry, start by eating small amounts of foods that are soft and easy to digest (bland), such as toast. Gradually return to your regular diet. Drink enough fluid to keep your urine pale yellow. If you vomit, rehydrate by drinking water, juice, or clear broth. General instructions If you have sleep apnea, surgery and certain  medicines can increase your risk for breathing problems. Follow instructions from your health care provider about wearing your sleep device: Anytime you are sleeping, including during daytime naps. While taking prescription pain medicines, sleeping medicines, or medicines that make you drowsy. Have a responsible adult stay with you for the time you are told. It is important to have someone help care for you until you are awake and alert. Return to your normal activities as told by your health care provider. Ask your health care provider what activities are safe for you. Take over-the-counter and prescription medicines only as told by your health care provider. If you smoke, do not smoke without supervision. Keep all follow-up visits as told by your health care provider. This is important. Contact a health care provider if: You have nausea or vomiting that does not get better with medicine. You cannot eat or drink without vomiting. You have pain that does not get better with  medicine. You are unable to pass urine. You develop a skin rash. You have a fever. You have redness around your IV site that gets worse. Get help right away if: You have difficulty breathing. You have chest pain. You have blood in your urine or stool, or you vomit blood. Summary After the procedure, it is common to have a sore throat or nausea. It is also common to feel tired. Have a responsible adult stay with you for the time you are told. It is important to have someone help care for you until you are awake and alert. When you feel hungry, start by eating small amounts of foods that are soft and easy to digest (bland), such as toast. Gradually return to your regular diet. Drink enough fluid to keep your urine pale yellow. Return to your normal activities as told by your health care provider. Ask your health care provider what activities are safe for you. This information is not intended to replace advice given to you by your health care provider. Make sure you discuss any questions you have with your healthcare provider. Document Revised: 04/19/2020 Document Reviewed: 11/17/2019 Elsevier Patient Education  2022 [place]. How to Use Chlorhexidine for Bathing Chlorhexidine gluconate (CHG) is a germ-killing (antiseptic) solution that is used to clean the skin. It can get rid of the bacteria that normally live on the skin and can keep them away for about 24 hours. To clean your skin with CHG, you may be given: A CHG solution to use in the shower or as part of a sponge bath. A prepackaged cloth that contains CHG. Cleaning your skin with CHG may help lower the risk for infection: While you are staying in the intensive care unit of the hospital. If you have a vascular access, such as a central line, to provide short-term or long-term access to your veins. If you have a catheter to drain urine from your bladder. If you are on a ventilator. A ventilator is a machine that helps you breathe by  moving air in and out of your lungs. After surgery. What are the risks? Risks of using CHG include: A skin reaction. Hearing loss, if CHG gets in your ears. Eye injury, if CHG gets in your eyes and is not rinsed out. The CHG product catching fire. Make sure that you avoid smoking and flames after applying CHG to your skin. Do not use CHG: If you have a chlorhexidine allergy or have previously reacted to chlorhexidine. On babies younger than 75 months of age. How to use  CHG solution Use CHG only as told by your health care provider, and follow the instructions on the label. Use the full amount of CHG as directed. Usually, this is one bottle. During a shower Follow these steps when using CHG solution during a shower (unless your health care provider gives you different instructions): Start the shower. Use your normal soap and shampoo to wash your face and hair. Turn off the shower or move out of the shower stream. Pour the CHG onto a clean washcloth. Do not use any type of brush or rough-edged sponge. Starting at your neck, lather your body down to your toes. Make sure you follow these instructions: If you will be having surgery, pay special attention to the part of your body where you will be having surgery. Scrub this area for at least 1 minute. Do not use CHG on your head or face. If the solution gets into your ears or eyes, rinse them well with water. Avoid your genital area. Avoid any areas of skin that have broken skin, cuts, or scrapes. Scrub your back and under your arms. Make sure to wash skin folds. Let the lather sit on your skin for 1-2 minutes or as long as told by your health care provider. Thoroughly rinse your entire body in the shower. Make sure that all body creases and crevices are rinsed well. Dry off with a clean towel. Do not put any substances on your body afterward--such as powder, lotion, or perfume--unless you are told to do so by your health care provider. Only  use lotions that are recommended by the manufacturer. Put on clean clothes or pajamas. If it is the night before your surgery, sleep in clean sheets.  During a sponge bath Follow these steps when using CHG solution during a sponge bath (unless your health care provider gives you different instructions): Use your normal soap and shampoo to wash your face and hair. Pour the CHG onto a clean washcloth. Starting at your neck, lather your body down to your toes. Make sure you follow these instructions: If you will be having surgery, pay special attention to the part of your body where you will be having surgery. Scrub this area for at least 1 minute. Do not use CHG on your head or face. If the solution gets into your ears or eyes, rinse them well with water. Avoid your genital area. Avoid any areas of skin that have broken skin, cuts, or scrapes. Scrub your back and under your arms. Make sure to wash skin folds. Let the lather sit on your skin for 1-2 minutes or as long as told by your health care provider. Using a different clean, wet washcloth, thoroughly rinse your entire body. Make sure that all body creases and crevices are rinsed well. Dry off with a clean towel. Do not put any substances on your body afterward--such as powder, lotion, or perfume--unless you are told to do so by your health care provider. Only use lotions that are recommended by the manufacturer. Put on clean clothes or pajamas. If it is the night before your surgery, sleep in clean sheets. How to use CHG prepackaged cloths Only use CHG cloths as told by your health care provider, and follow the instructions on the label. Use the CHG cloth on clean, dry skin. Do not use the CHG cloth on your head or face unless your health care provider tells you to. When washing with the CHG cloth: Avoid your genital area. Avoid any areas of  skin that have broken skin, cuts, or scrapes. Before surgery Follow these steps when using a CHG  cloth to clean before surgery (unless your health care provider gives you different instructions): Using the CHG cloth, vigorously scrub the part of your body where you will be having surgery. Scrub using a back-and-forth motion for 3 minutes. The area on your body should be completely wet with CHG when you are done scrubbing. Do not rinse. Discard the cloth and let the area air-dry. Do not put any substances on the area afterward, such as powder, lotion, or perfume. Put on clean clothes or pajamas. If it is the night before your surgery, sleep in clean sheets.  For general bathing Follow these steps when using CHG cloths for general bathing (unless your health care provider gives you different instructions). Use a separate CHG cloth for each area of your body. Make sure you wash between any folds of skin and between your fingers and toes. Wash your body in the following order, switching to a new cloth after each step: The front of your neck, shoulders, and chest. Both of your arms, under your arms, and your hands. Your stomach and groin area, avoiding the genitals. Your right leg and foot. Your left leg and foot. The back of your neck, your back, and your buttocks. Do not rinse. Discard the cloth and let the area air-dry. Do not put any substances on your body afterward--such as powder, lotion, or perfume--unless you are told to do so by your health care provider. Only use lotions that are recommended by the manufacturer. Put on clean clothes or pajamas. Contact a health care provider if: Your skin gets irritated after scrubbing. You have questions about using your solution or cloth. Get help right away if: Your eyes become very red or swollen. Your eyes itch badly. Your skin itches badly and is red or swollen. Your hearing changes. You have trouble seeing. You have swelling or tingling in your mouth or throat. You have trouble breathing. You swallow any  chlorhexidine. Summary Chlorhexidine gluconate (CHG) is a germ-killing (antiseptic) solution that is used to clean the skin. Cleaning your skin with CHG may help to lower your risk for infection. You may be given CHG to use for bathing. It may be in a bottle or in a prepackaged cloth to use on your skin. Carefully follow your health care provider's instructions and the instructions on the product label. Do not use CHG if you have a chlorhexidine allergy. Contact your health care provider if your skin gets irritated after scrubbing. This information is not intended to replace advice given to you by your health care provider. Make sure you discuss any questions you have with your healthcare provider. Document Revised: 12/16/2019 Document Reviewed: 01/20/2020 Elsevier Patient Education  [place].

## 2021-02-12 ENCOUNTER — Encounter: Payer: Self-pay | Admitting: General Surgery

## 2021-02-12 ENCOUNTER — Other Ambulatory Visit: Payer: Self-pay

## 2021-02-12 ENCOUNTER — Encounter (HOSPITAL_COMMUNITY)
Admission: RE | Admit: 2021-02-12 | Discharge: 2021-02-12 | Disposition: A | Discharge status: Home / Self Care | Payer: Managed Care, Other (non HMO) | Source: Ambulatory Visit | Attending: General Surgery | Admitting: General Surgery

## 2021-02-12 ENCOUNTER — Other Ambulatory Visit (HOSPITAL_COMMUNITY): Payer: Managed Care, Other (non HMO) | Attending: General Surgery

## 2021-02-12 ENCOUNTER — Other Ambulatory Visit (HOSPITAL_COMMUNITY): Payer: Managed Care, Other (non HMO)

## 2021-02-12 ENCOUNTER — Encounter (HOSPITAL_COMMUNITY): Payer: Self-pay

## 2021-02-12 ENCOUNTER — Ambulatory Visit (INDEPENDENT_AMBULATORY_CARE_PROVIDER_SITE_OTHER): Payer: Managed Care, Other (non HMO) | Admitting: General Surgery

## 2021-02-12 VITALS — BP 147/91 | HR 60 | Temp 97.6°F | Ht 74.0 in | Wt 248.8 lb

## 2021-02-12 DIAGNOSIS — K4090 Unilateral inguinal hernia, without obstruction or gangrene, not specified as recurrent: Secondary | ICD-10-CM

## 2021-02-12 DIAGNOSIS — Z01818 Encounter for other preprocedural examination: Secondary | ICD-10-CM | POA: Diagnosis present

## 2021-02-12 HISTORY — DX: Unspecified osteoarthritis, unspecified site: M19.90

## 2021-02-12 LAB — CBC WITH DIFFERENTIAL/PLATELET
Abs Immature Granulocytes: 0.01 10*3/uL (ref 0.00–0.07)
Basophils Absolute: 0 10*3/uL (ref 0.0–0.1)
Basophils Relative: 0 %
Eosinophils Absolute: 0.1 10*3/uL (ref 0.0–0.5)
Eosinophils Relative: 2 %
HCT: 46.3 % (ref 39.0–52.0)
Hemoglobin: 16.2 g/dL (ref 13.0–17.0)
Immature Granulocytes: 0 %
Lymphocytes Relative: 28 %
Lymphs Abs: 1.6 10*3/uL (ref 0.7–4.0)
MCH: 31.8 pg (ref 26.0–34.0)
MCHC: 35 g/dL (ref 30.0–36.0)
MCV: 90.8 fL (ref 80.0–100.0)
Monocytes Absolute: 0.4 10*3/uL (ref 0.1–1.0)
Monocytes Relative: 7 %
Neutro Abs: 3.6 10*3/uL (ref 1.7–7.7)
Neutrophils Relative %: 63 %
Platelets: 179 10*3/uL (ref 150–400)
RBC: 5.1 MIL/uL (ref 4.22–5.81)
RDW: 13.2 % (ref 11.5–15.5)
WBC: 5.7 10*3/uL (ref 4.0–10.5)
nRBC: 0 % (ref 0.0–0.2)

## 2021-02-12 NOTE — Progress Notes (Signed)
[facility] History and Physical   Chief Complaint   Follow-up     Andrew Ryan is a 55 y.o. male.  HPI: Patient seen by me in February for a right inguinal hernia that is starting to be more symptomatic. 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 He says the bulge is slightly larger but not major, and he has some minor discomfort.  He had his carotid doppler and everything was normal after his history of dissection from coughing in 2018.    No nausea or vomiting or associated symptoms. No chest pain or SOB.  The discomfort comes and goes and feels like pulling.   Past Medical History:  Diagnosis Date   Arthritis    Atypical nevus 05/19/2008   Right Medial Lower Abdomen-Moderate (Dr. Waldon Merl) and Right Lateral Mid Abdomen-Moderate to Marked (Dr. Waldon Merl)   Atypical nevus 05/28/2010   Right Lateral Abd-Moderate, Right Upper Medial Arm-Mild and Left Lateral Abdomen-Mild (Dr. Waldon Merl)   [place] (basal cell carcinoma of skin) 05/19/2008   Left Medial Shoulder (Dr. Waldon Merl)   [place] (basal cell carcinoma of skin) 02/03/2011   Above Right Ear (MOH's)   BCC (basal cell carcinoma of skin) 06/30/2011   Left Upper Back (Cx3,5FU) and Upper Mid Back (Cx3,5FU)   BCC (basal cell carcinoma of skin) 03/15/2014   Right Forearm (tx p bx)   GERD (gastroesophageal reflux disease)    H. pylori infection    Histoplasmosis    Hx of adenomatous colonic polyps 07/29/2017   Skin cancer, basal cell    Squamous cell skin cancer    Stroke ([place]) 09/2016   Superficial basal cell carcinoma (BCC) 05/19/2008   Left Lateral Shoulder, and Left Lateral Neck (Dr. Waldon Merl)    Past Surgical History:  Procedure Laterality Date   HERNIA REPAIR     x 2 umbilical and inguinal   KNEE ARTHROSCOPY Bilateral    UPPER GASTROINTESTINAL ENDOSCOPY      Family History  Problem Relation Age of Onset    Congestive Heart Failure Father    COPD Father    Lung cancer Paternal Grandfather        smoker, coal miner   Lung cancer Maternal Grandfather        smoker, coal miner   Heart attack Maternal Grandfather    Colon cancer Neg Hx    Esophageal cancer Neg Hx    Stomach cancer Neg Hx     Social History   Tobacco Use   Smoking status: Never   Smokeless tobacco: Never  Vaping Use   Vaping Use: Never used  Substance Use Topics   Alcohol use: Yes    Alcohol/week: 0.0 standard drinks    Comment: occ   Drug use: No    Medications: I have reviewed the patient's current medications. Prior to Admission: (Not in a hospital admission)  Scheduled: Continuous:  sodium chloride     PRN: Allergies as of 02/12/2021   No Known Allergies      Medication List        Accurate as of February 12, 2021  9:25 AM. If you have any questions, ask your nurse or doctor.          ASPIRIN 81 PO Take 81 mg by mouth daily.   atorvastatin 20 MG tablet Commonly known as: LIPITOR Take 20 mg by mouth daily.   cetirizine 10  MG tablet Commonly known as: ZYRTEC Take 10 mg by mouth daily.   escitalopram 10 MG tablet Commonly known as: LEXAPRO Take 10 mg by mouth daily.   multivitamin with minerals tablet Take 1 tablet by mouth daily.   omeprazole 40 MG capsule Commonly known as: PRILOSEC Take 40 mg by mouth daily.   OSTEO BI-FLEX ADV JOINT SHIELD PO Take 2 tablets by mouth daily.         ROS:  A comprehensive review of systems was negative except for: Gastrointestinal: positive for right inguinal hernia  Blood pressure (!) 147/91, pulse 60, temperature 97.6 F (36.4 C), temperature source Oral, height 6\' 2"  (1.88 m), weight 248 lb 12.8 oz (112.9 kg), SpO2 98 %. Physical Exam Vitals reviewed.  Constitutional:      Appearance: Normal appearance.  HENT:     Head: Normocephalic.     Nose: Nose normal.     Mouth/Throat:     Mouth: Mucous membranes are moist.  Eyes:      Extraocular Movements: Extraocular movements intact.  Cardiovascular:     Rate and Rhythm: Normal rate and regular rhythm.  Pulmonary:     Effort: Pulmonary effort is normal.     Breath sounds: Normal breath sounds.  Abdominal:     General: There is no distension.     Palpations: Abdomen is soft.     Tenderness: There is no abdominal tenderness.     Hernia: A hernia is present. Hernia is present in the right inguinal area.     Comments: Reducible hernia  Musculoskeletal:        General: Normal range of motion.     Cervical back: Normal range of motion.  Skin:    General: Skin is warm.  Neurological:     General: No focal deficit present.     Mental Status: He is alert and oriented to person, place, and time.  Psychiatric:        Mood and Affect: Mood normal.        Behavior: Behavior normal.        Thought Content: Thought content normal.        Judgment: Judgment normal.    Results:       Assessment & Plan:  Andrew Ryan is a 55 y.o. male with a right inguinal hernia he is ready to get repaired. Plans for OR tomorrow.  -Discussed the risk and benefits including, bleeding, infection, use of mesh, risk of recurrence, risk of nerve damage causing numbness or changes in sensation, risk of damage to the cord structures. The patient understands the risk and benefits of repair with mesh, and has decided to proceed.  We also discussed open versus laparoscopic surgery and the use of mesh. We discussed that I do open repairs with mesh, and that this is considered equivalent to laparoscopic surgery. We discussed reasons for opting for laparoscopic surgery including if a bilateral repair is needed or if a patient has a recurrence after an open repair.   All questions were answered to the satisfaction of the patient.   Virl Cagey 02/12/2021, 9:25 AM

## 2021-02-12 NOTE — Patient Instructions (Signed)
Open Hernia Repair, Adult °Open hernia repair is a surgical procedure to fix a hernia. A hernia occurs when an internal organ or tissue pushes through a weak spot in the muscles along the wall of the abdomen. Hernias commonly occur in the groin and around the belly button. °Most hernias tend to get worse over time. Often, surgery is done to prevent the hernia from becoming bigger, uncomfortable, or an emergency. Emergency surgery may be needed if contents of the abdomen get stuck in the opening (incarcerated hernia) or if the blood supply gets cut off (strangulated hernia). In an open repair, an incision is made in the abdomen to perform the surgery. °Tell a health care provider about: °Any allergies you have. °All medicines you are taking, including vitamins, herbs, eye drops, creams, and over-the-counter medicines. °Any problems you or family members have had with anesthetic medicines. °Any blood or bone disorders you have. °Any surgeries you have had. °Any medical conditions you have, including any recent cold or flu (influenza)symptoms. °Whether you are pregnant or may be pregnant. °What are the risks? °Generally, this is a safe procedure. However, problems may occur, including: °Long-lasting (chronic) pain. °Bleeding. °Infection. °Damage to the testicles. This can cause shrinking or swelling. °Damage to nearby structures or organs, including the bladder, blood vessels, intestines, or nerves near the hernia. °Blood clots. °Trouble passing urine. °Return of the hernia. °What happens before the procedure? °Medicines °Ask your health care provider about: °Changing or stopping your regular medicines. This is especially important if you are taking diabetes medicines or blood thinners. °Taking medicines such as aspirin and ibuprofen. These medicines can thin your blood. Do not take these medicines unless your health care provider tells you to take them. °Taking over-the-counter medicines, vitamins, herbs, and  supplements. °Surgery safety °Ask your health care provider: °How your surgery site will be marked. °What steps will be taken to help prevent infection. These steps may include: °Removing hair at the surgery site. °Washing skin with a germ-killing soap. °Receiving antibiotic medicine. °General instructions °You may have an exam or testing, such as blood tests or imaging studies. °Do not use any products that contain nicotine or tobacco for at least 4 weeks before the procedure. These products include cigarettes, chewing tobacco, and vaping devices, such as e-cigarettes. If you need help quitting, ask your health care provider. °Let your health care provider know if you develop a cold or any infection before your surgery. If you get an infection before surgery, you may receive antibiotics to treat it. °Plan to have a responsible adult take you home from the hospital or clinic. °If you will be going home right after the procedure, plan to have a responsible adult care for you for the time you are told. This is important. °What happens during the procedure? ° °An IV will be inserted into one of your veins. °You will be given one or more of the following: °A medicine to help you relax (sedative). °A medicine to numb the area (local anesthetic). °A medicine to make you fall asleep (general anesthetic). °Your surgeon will make an incision over the hernia. °The tissues of the hernia will be moved back into place. °The edges of the hernia may be stitched (sutured) together. °The opening in the abdominal muscles will be closed with stitches (sutures). Or, your surgeon will place a mesh patch made of artificial (synthetic) material over the opening. °The incision will be closed with sutures, skin glue, or adhesive strips. °A bandage (dressing) may be   placed over the incision. °The procedure may vary among health care providers and hospitals. °What happens after the procedure? °Your blood pressure, heart rate, breathing rate,  and blood oxygen level will be monitored until you leave the hospital or clinic. °You may be given medicine for pain. °If you were given a sedative during the procedure, it can affect you for several hours. Do not drive or operate machinery until your health care provider says that it is safe. °Summary °Open hernia repair is a surgical procedure to fix a hernia. Hernias commonly occur in the groin and around the belly button. °Emergency surgery may be needed if contents of the abdomen get stuck in the opening (incarcerated hernia) or if the blood supply gets cut off (strangulated hernia). °In this procedure, an incision is made in the abdomen to perform the surgery. °After the procedure, you may be given medicine for pain. °This information is not intended to replace advice given to you by your health care provider. Make sure you discuss any questions you have with your health care provider. °Document Revised: 03/19/2020 Document Reviewed: 03/19/2020 °Elsevier Patient Education © 2022 [employer] Inc[employer] ° °

## 2021-02-12 NOTE — H&P (Signed)
[facility] History and Physical   Chief Complaint   Follow-up     Andrew Ryan is a 55 y.o. male.  HPI: Patient seen by me in February for a right inguinal hernia that is starting to be more symptomatic. 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 He says the bulge is slightly larger but not major, and he has some minor discomfort.  He had his carotid doppler and everything was normal after his history of dissection from coughing in 2018.    No nausea or vomiting or associated symptoms. No chest pain or SOB.  The discomfort comes and goes and feels like pulling.   Past Medical History:  Diagnosis Date   Arthritis    Atypical nevus 05/19/2008   Right Medial Lower Abdomen-Moderate (Dr. Waldon Merl) and Right Lateral Mid Abdomen-Moderate to Marked (Dr. Waldon Merl)   Atypical nevus 05/28/2010   Right Lateral Abd-Moderate, Right Upper Medial Arm-Mild and Left Lateral Abdomen-Mild (Dr. Waldon Merl)   [place] (basal cell carcinoma of skin) 05/19/2008   Left Medial Shoulder (Dr. Waldon Merl)   [place] (basal cell carcinoma of skin) 02/03/2011   Above Right Ear (MOH's)   BCC (basal cell carcinoma of skin) 06/30/2011   Left Upper Back (Cx3,5FU) and Upper Mid Back (Cx3,5FU)   BCC (basal cell carcinoma of skin) 03/15/2014   Right Forearm (tx p bx)   GERD (gastroesophageal reflux disease)    H. pylori infection    Histoplasmosis    Hx of adenomatous colonic polyps 07/29/2017   Skin cancer, basal cell    Squamous cell skin cancer    Stroke ([place]) 09/2016   Superficial basal cell carcinoma (BCC) 05/19/2008   Left Lateral Shoulder, and Left Lateral Neck (Dr. Waldon Merl)    Past Surgical History:  Procedure Laterality Date   HERNIA REPAIR     x 2 umbilical and inguinal   KNEE ARTHROSCOPY Bilateral    UPPER GASTROINTESTINAL ENDOSCOPY      Family History  Problem Relation Age of Onset    Congestive Heart Failure Father    COPD Father    Lung cancer Paternal Grandfather        smoker, coal miner   Lung cancer Maternal Grandfather        smoker, coal miner   Heart attack Maternal Grandfather    Colon cancer Neg Hx    Esophageal cancer Neg Hx    Stomach cancer Neg Hx     Social History   Tobacco Use   Smoking status: Never   Smokeless tobacco: Never  Vaping Use   Vaping Use: Never used  Substance Use Topics   Alcohol use: Yes    Alcohol/week: 0.0 standard drinks    Comment: occ   Drug use: No    Medications: I have reviewed the patient's current medications. Prior to Admission: (Not in a hospital admission)  Scheduled: Continuous:  sodium chloride     PRN: Allergies as of 02/12/2021   No Known Allergies      Medication List        Accurate as of February 12, 2021  9:25 AM. If you have any questions, ask your nurse or doctor.          ASPIRIN 81 PO Take 81 mg by mouth daily.   atorvastatin 20 MG tablet Commonly known as: LIPITOR Take 20 mg by mouth daily.   cetirizine 10  MG tablet Commonly known as: ZYRTEC Take 10 mg by mouth daily.   escitalopram 10 MG tablet Commonly known as: LEXAPRO Take 10 mg by mouth daily.   multivitamin with minerals tablet Take 1 tablet by mouth daily.   omeprazole 40 MG capsule Commonly known as: PRILOSEC Take 40 mg by mouth daily.   OSTEO BI-FLEX ADV JOINT SHIELD PO Take 2 tablets by mouth daily.         ROS:  A comprehensive review of systems was negative except for: Gastrointestinal: positive for right inguinal hernia  Blood pressure (!) 147/91, pulse 60, temperature 97.6 F (36.4 C), temperature source Oral, height 6\' 2"  (1.88 m), weight 248 lb 12.8 oz (112.9 kg), SpO2 98 %. Physical Exam Vitals reviewed.  Constitutional:      Appearance: Normal appearance.  HENT:     Head: Normocephalic.     Nose: Nose normal.     Mouth/Throat:     Mouth: Mucous membranes are moist.  Eyes:      Extraocular Movements: Extraocular movements intact.  Cardiovascular:     Rate and Rhythm: Normal rate and regular rhythm.  Pulmonary:     Effort: Pulmonary effort is normal.     Breath sounds: Normal breath sounds.  Abdominal:     General: There is no distension.     Palpations: Abdomen is soft.     Tenderness: There is no abdominal tenderness.     Hernia: A hernia is present. Hernia is present in the right inguinal area.     Comments: Reducible hernia  Musculoskeletal:        General: Normal range of motion.     Cervical back: Normal range of motion.  Skin:    General: Skin is warm.  Neurological:     General: No focal deficit present.     Mental Status: He is alert and oriented to person, place, and time.  Psychiatric:        Mood and Affect: Mood normal.        Behavior: Behavior normal.        Thought Content: Thought content normal.        Judgment: Judgment normal.    Results:       Assessment & Plan:  Andrew Ryan is a 55 y.o. male with a right inguinal hernia he is ready to get repaired. Plans for OR tomorrow.  -Discussed the risk and benefits including, bleeding, infection, use of mesh, risk of recurrence, risk of nerve damage causing numbness or changes in sensation, risk of damage to the cord structures. The patient understands the risk and benefits of repair with mesh, and has decided to proceed.  We also discussed open versus laparoscopic surgery and the use of mesh. We discussed that I do open repairs with mesh, and that this is considered equivalent to laparoscopic surgery. We discussed reasons for opting for laparoscopic surgery including if a bilateral repair is needed or if a patient has a recurrence after an open repair.   All questions were answered to the satisfaction of the patient.   Virl Cagey 02/12/2021, 9:25 AM

## 2021-02-13 ENCOUNTER — Encounter (HOSPITAL_COMMUNITY)
Admission: RE | Disposition: A | Discharge status: Home / Self Care | Payer: Self-pay | Source: Home / Self Care | Attending: General Surgery

## 2021-02-13 ENCOUNTER — Ambulatory Visit (HOSPITAL_COMMUNITY)
Admission: RE | Admit: 2021-02-13 | Discharge: 2021-02-13 | Disposition: A | Discharge status: Home / Self Care | Payer: Managed Care, Other (non HMO) | Attending: General Surgery | Admitting: General Surgery

## 2021-02-13 ENCOUNTER — Ambulatory Visit (HOSPITAL_COMMUNITY): Payer: Managed Care, Other (non HMO) | Admitting: Certified Registered Nurse Anesthetist

## 2021-02-13 ENCOUNTER — Encounter (HOSPITAL_COMMUNITY): Payer: Self-pay | Admitting: General Surgery

## 2021-02-13 DIAGNOSIS — Z7982 Long term (current) use of aspirin: Secondary | ICD-10-CM | POA: Diagnosis not present

## 2021-02-13 DIAGNOSIS — Z86010 Personal history of colonic polyps: Secondary | ICD-10-CM | POA: Insufficient documentation

## 2021-02-13 DIAGNOSIS — Z79899 Other long term (current) drug therapy: Secondary | ICD-10-CM | POA: Diagnosis not present

## 2021-02-13 DIAGNOSIS — Z8673 Personal history of transient ischemic attack (TIA), and cerebral infarction without residual deficits: Secondary | ICD-10-CM | POA: Insufficient documentation

## 2021-02-13 DIAGNOSIS — K4090 Unilateral inguinal hernia, without obstruction or gangrene, not specified as recurrent: Secondary | ICD-10-CM

## 2021-02-13 DIAGNOSIS — Z85828 Personal history of other malignant neoplasm of skin: Secondary | ICD-10-CM | POA: Diagnosis not present

## 2021-02-13 DIAGNOSIS — Z801 Family history of malignant neoplasm of trachea, bronchus and lung: Secondary | ICD-10-CM | POA: Diagnosis not present

## 2021-02-13 HISTORY — PX: INGUINAL HERNIA REPAIR: SHX194

## 2021-02-13 SURGERY — REPAIR, HERNIA, INGUINAL, ADULT
Anesthesia: General | Site: Groin | Laterality: Right

## 2021-02-13 MED ORDER — PROPOFOL 10 MG/ML IV BOLUS
INTRAVENOUS | Status: AC
  Filled 2021-02-13: qty 20

## 2021-02-13 MED ORDER — ORAL CARE MOUTH RINSE: 15.0000 mL | Freq: Once | OROMUCOSAL | Status: AC

## 2021-02-13 MED ORDER — LIDOCAINE HCL (CARDIAC) PF 100 MG/5ML IV SOSY
PREFILLED_SYRINGE | INTRAVENOUS | Status: DC | PRN
  Administered 2021-02-13: 100 mg via INTRAVENOUS

## 2021-02-13 MED ORDER — CHLORHEXIDINE GLUCONATE CLOTH 2 % EX PADS: 6.0000 | MEDICATED_PAD | Freq: Once | CUTANEOUS | Status: DC

## 2021-02-13 MED ORDER — FENTANYL CITRATE (PF) 250 MCG/5ML IJ SOLN
INTRAMUSCULAR | Status: DC | PRN
  Administered 2021-02-13 (×2): 25 ug via INTRAVENOUS
  Administered 2021-02-13 (×3): 50 ug via INTRAVENOUS

## 2021-02-13 MED ORDER — MIDAZOLAM HCL 2 MG/2ML IJ SOLN
INTRAMUSCULAR | Status: DC | PRN
  Administered 2021-02-13: 2 mg via INTRAVENOUS

## 2021-02-13 MED ORDER — SODIUM CHLORIDE 0.9 % IR SOLN
Status: DC | PRN
  Administered 2021-02-13: 1000 mL

## 2021-02-13 MED ORDER — PROPOFOL 10 MG/ML IV BOLUS
INTRAVENOUS | Status: DC | PRN
  Administered 2021-02-13 (×2): 50 mg via INTRAVENOUS
  Administered 2021-02-13: 250 mg via INTRAVENOUS

## 2021-02-13 MED ORDER — ONDANSETRON HCL 4 MG/2ML IJ SOLN: 4.0000 mg | Freq: Once | INTRAMUSCULAR | Status: DC | PRN

## 2021-02-13 MED ORDER — SEVOFLURANE IN SOLN
RESPIRATORY_TRACT | Status: AC
  Filled 2021-02-13: qty 250

## 2021-02-13 MED ORDER — BUPIVACAINE LIPOSOME 1.3 % IJ SUSP
INTRAMUSCULAR | Status: AC
  Filled 2021-02-13: qty 20

## 2021-02-13 MED ORDER — ONDANSETRON HCL 4 MG/2ML IJ SOLN
INTRAMUSCULAR | Status: DC | PRN
  Administered 2021-02-13: 4 mg via INTRAVENOUS

## 2021-02-13 MED ORDER — ONDANSETRON HCL 4 MG PO TABS: 4.0000 mg | ORAL_TABLET | Freq: Three times a day (TID) | ORAL | 1 refills | Status: DC | PRN

## 2021-02-13 MED ORDER — FENTANYL CITRATE (PF) 250 MCG/5ML IJ SOLN
INTRAMUSCULAR | Status: AC
  Filled 2021-02-13: qty 5

## 2021-02-13 MED ORDER — CHLORHEXIDINE GLUCONATE 0.12 % MT SOLN
15.0000 mL | Freq: Once | OROMUCOSAL | Status: AC
  Administered 2021-02-13: 15 mL via OROMUCOSAL

## 2021-02-13 MED ORDER — KETOROLAC TROMETHAMINE 30 MG/ML IJ SOLN
INTRAMUSCULAR | Status: AC
  Filled 2021-02-13: qty 1

## 2021-02-13 MED ORDER — CEFAZOLIN SODIUM-DEXTROSE 2-4 GM/100ML-% IV SOLN
2.0000 g | INTRAVENOUS | Status: AC
  Administered 2021-02-13: 2 g via INTRAVENOUS
  Filled 2021-02-13: qty 100

## 2021-02-13 MED ORDER — MIDAZOLAM HCL 2 MG/2ML IJ SOLN
INTRAMUSCULAR | Status: AC
  Filled 2021-02-13: qty 2

## 2021-02-13 MED ORDER — LACTATED RINGERS IV SOLN: INTRAVENOUS | Status: DC

## 2021-02-13 MED ORDER — KETOROLAC TROMETHAMINE 30 MG/ML IJ SOLN
30.0000 mg | Freq: Once | INTRAMUSCULAR | Status: AC
  Administered 2021-02-13: 30 mg via INTRAVENOUS

## 2021-02-13 MED ORDER — LIDOCAINE HCL (PF) 2 % IJ SOLN
INTRAMUSCULAR | Status: AC
  Filled 2021-02-13: qty 5

## 2021-02-13 MED ORDER — FENTANYL CITRATE (PF) 100 MCG/2ML IJ SOLN: 25.0000 ug | INTRAMUSCULAR | Status: DC | PRN

## 2021-02-13 MED ORDER — OXYCODONE HCL 5 MG PO TABS: 5.0000 mg | ORAL_TABLET | ORAL | 0 refills | Status: DC | PRN

## 2021-02-13 MED ORDER — ONDANSETRON HCL 4 MG/2ML IJ SOLN
INTRAMUSCULAR | Status: AC
  Filled 2021-02-13: qty 2

## 2021-02-13 MED ORDER — BUPIVACAINE LIPOSOME 1.3 % IJ SUSP
INTRAMUSCULAR | Status: DC | PRN
  Administered 2021-02-13: 20 mL

## 2021-02-13 SURGICAL SUPPLY — 36 items
ADH SKN CLS APL DERMABOND .7 (GAUZE/BANDAGES/DRESSINGS) ×1
CLOTH BEACON ORANGE TIMEOUT ST (SAFETY) ×3 IMPLANT
COVER LIGHT HANDLE STERIS (MISCELLANEOUS) ×6 IMPLANT
DERMABOND ADVANCED (GAUZE/BANDAGES/DRESSINGS) ×2
DERMABOND ADVANCED .7 DNX12 (GAUZE/BANDAGES/DRESSINGS) ×1 IMPLANT
DRAIN PENROSE 0.5X18 (DRAIN) ×3 IMPLANT
ELECT REM PT RETURN 9FT ADLT (ELECTROSURGICAL) ×3
ELECTRODE REM PT RTRN 9FT ADLT (ELECTROSURGICAL) ×1 IMPLANT
GAUZE SPONGE 4X4 12PLY STRL (GAUZE/BANDAGES/DRESSINGS) ×3 IMPLANT
GLOVE SURG ENC MOIS LTX SZ6.5 (GLOVE) ×3 IMPLANT
GLOVE SURG LTX SZ7 (GLOVE) ×3 IMPLANT
GLOVE SURG POLYISO LF SZ8 (GLOVE) ×3 IMPLANT
GLOVE SURG UNDER POLY LF SZ6.5 (GLOVE) ×3 IMPLANT
GLOVE SURG UNDER POLY LF SZ7 (GLOVE) ×9 IMPLANT
GOWN STRL REUS W/TWL LRG LVL3 (GOWN DISPOSABLE) ×9 IMPLANT
GOWN STRL REUS W/TWL XL LVL3 (GOWN DISPOSABLE) ×3 IMPLANT
INST SET MINOR GENERAL (KITS) ×3 IMPLANT
KIT TURNOVER KIT A (KITS) ×3 IMPLANT
MANIFOLD NEPTUNE II (INSTRUMENTS) ×3 IMPLANT
MESH MARLEX PLUG MEDIUM (Mesh General) ×3 IMPLANT
NEEDLE HYPO 18GX1.5 BLUNT FILL (NEEDLE) ×3 IMPLANT
NEEDLE HYPO 21X1.5 SAFETY (NEEDLE) ×3 IMPLANT
NS IRRIG 1000ML POUR BTL (IV SOLUTION) ×3 IMPLANT
PACK MINOR (CUSTOM PROCEDURE TRAY) ×3 IMPLANT
PAD ARMBOARD 7.5X6 YLW CONV (MISCELLANEOUS) ×9 IMPLANT
PENCIL SMOKE EVACUATOR (MISCELLANEOUS) ×3 IMPLANT
SET BASIN LINEN APH (SET/KITS/TRAYS/PACK) ×3 IMPLANT
SOL PREP PROV IODINE SCRUB 4OZ (MISCELLANEOUS) ×3 IMPLANT
SUT MNCRL AB 4-0 PS2 18 (SUTURE) ×3 IMPLANT
SUT NOVA NAB GS-22 2 2-0 T-19 (SUTURE) ×9 IMPLANT
SUT VIC AB 2-0 CT1 27 (SUTURE) ×3
SUT VIC AB 2-0 CT1 TAPERPNT 27 (SUTURE) ×1 IMPLANT
SUT VIC AB 3-0 SH 27 (SUTURE) ×6
SUT VIC AB 3-0 SH 27X BRD (SUTURE) ×2 IMPLANT
SYR 20ML LL LF (SYRINGE) ×3 IMPLANT
SYR 30ML LL (SYRINGE) ×3 IMPLANT

## 2021-02-13 NOTE — Anesthesia Procedure Notes (Signed)
Procedure Name: LMA Insertion Date/Time: 02/13/2021 7:43 AM Performed by: Karna Dupes, CRNA Pre-anesthesia Checklist: Patient identified, Emergency Drugs available, Suction available and Patient being monitored Patient Re-evaluated:Patient Re-evaluated prior to induction Oxygen Delivery Method: Circle system utilized Preoxygenation: Pre-oxygenation with 100% oxygen Induction Type: IV induction LMA: LMA inserted LMA Size: 4.0 Number of attempts: 1 Tube secured with: Tape Dental Injury: Teeth and Oropharynx as per pre-operative assessment

## 2021-02-13 NOTE — Discharge Instructions (Signed)
Discharge Instructions Hernia:  Common Complaints: Pain at the incision site is common. This will improve with time. Take your pain medications as described below. Some nausea is common and poor appetite. The main goal is to stay hydrated the first few days after surgery.  Numbness at the incision or the thigh is common.  If you start to have burning or tingling pain in your groin, this is from a nerve being pinched. Please call and we can prescribe you a different type of pain medication for nerve pain.  Bruising and swelling is common in the area especially in the scrotum. Elevate the scrotum with a towel.   Diet/ Activity: Diet as tolerated. You may not have an appetite, but it is important to stay hydrated. Drink 64 ounces of water a day. Your appetite will return with time.  Shower per your regular routine daily.  Do not take hot showers. Take warm showers that are less than 10 minutes. Rest and listen to your body, but do not remain in bed all day.  Walk everyday for at least 15-20 minutes. Deep cough and move around every 1-2 hours in the first few days after surgery.  Do not pick at the dermabond glue on your incision sites.  This glue film will remain in place for 1-2 weeks and will start to peel off.  Do not place lotions or balms on your incision unless instructed to specifically by Dr. Constance Haw.  Do not lift > 10 lbs, perform excessive bending, pushing, pulling, squatting for 6-8 weeks after surgery.   Pain Expectations and Narcotics: -After surgery you will have pain associated with your incisions and this is normal. The pain is muscular and nerve pain, and will get better with time. -You are encouraged and expected to take non narcotic medications like tylenol and ibuprofen (when able) to treat pain as multiple modalities can aid with pain treatment. -Narcotics are only used when pain is severe or there is breakthrough pain. -You are not expected to have a pain score of 0 after  surgery, as we cannot prevent pain. A pain score of 3-4 that allows you to be functional, move, walk, and tolerate some activity is the goal. The pain will continue to improve over the days after surgery and is dependent on your surgery. -Due to [place] law, we are only able to give a certain amount of pain medication to treat post operative pain, and we only give additional narcotics on a patient by patient basis.  -For most laparoscopic surgery, studies have shown that the majority of patients only need 10-15 narcotic pills, and for open surgeries most patients only need 15-20.   -Having appropriate expectations of pain and knowledge of pain management with non narcotics is important as we do not want anyone to become addicted to narcotic pain medication.  -Using ice packs in the first 48 hours and heating pads after 48 hours, wearing an abdominal binder (when recommended), and using over the counter medications are all ways to help with pain management.   -Simple acts like meditation and mindfulness practices after surgery can also help with pain control and research has proven the benefit of these practices.  Medication: Take tylenol and ibuprofen as needed for pain control, alternating every 4-6 hours.  Example:  Tylenol 1000mg  @ 6am, 12noon, 6pm, 67midnight (Do not exceed 4000mg  of tylenol a day). Ibuprofen 800mg  @ 9am, 3pm, 9pm, 3am (Do not exceed 3600mg  of ibuprofen a day).  Take Roxicodone for breakthrough pain  every 4 hours.  Take Colace for constipation related to narcotic pain medication. If you do not have a bowel movement in 2 days, take Miralax over the counter.  Drink plenty of water to also prevent constipation.   Contact Information: If you have questions or concerns, please call our office, [phone], Monday- Thursday 8AM-5PM and Friday 8AM-12Noon.  If it is after hours or on the weekend, please call [facility], [phone], [phone], and ask to speak to the surgeon on  call for Dr. Constance Haw at [facility].

## 2021-02-13 NOTE — Progress Notes (Signed)
[facility]  Updated wife about surgery. Patient returning to work 02/25/2021 with restrictions. Wife brought paper that I filled out. Office has other FMLA paperwork. Letter given to him for return with restrictions so he has that too.     Curlene Labrum, MD [facility] [address] [place], [place] [address] [phone] (office)

## 2021-02-13 NOTE — Interval H&P Note (Signed)
History and Physical Interval Note:  02/13/2021 7:30 AM  Andrew Ryan  has presented today for surgery, with the diagnosis of Right inguinal hernia.  The various methods of treatment have been discussed with the patient and family. After consideration of risks, benefits and other options for treatment, the patient has consented to  Procedure(s): HERNIA REPAIR INGUINAL ADULT W/MESH (Right) as a surgical intervention.  The patient's history has been reviewed, patient examined, no change in status, stable for surgery.  I have reviewed the patient's chart and labs.  Questions were answered to the patient's satisfaction.     Virl Cagey

## 2021-02-13 NOTE — Transfer of Care (Signed)
Immediate Anesthesia Transfer of Care Note  Patient: Andrew Ryan  Procedure(s) Performed: HERNIA REPAIR INGUINAL ADULT W/MESH (Right: Groin)  Patient Location: PACU  Anesthesia Type:General  Level of Consciousness: drowsy  Airway & Oxygen Therapy: Patient Spontanous Breathing and Patient connected to nasal cannula oxygen  Post-op Assessment: Report given to RN and Post -op Vital signs reviewed and stable  Post vital signs: Reviewed and stable  Last Vitals:  Vitals Value Taken Time  BP 89/63 02/13/21 0900  Temp    Pulse 53 02/13/21 0902  Resp 10 02/13/21 0902  SpO2 92 % 02/13/21 0902  Vitals shown include unvalidated device data.  Last Pain:  Vitals:   02/13/21 0655  TempSrc: Oral  PainSc: 0-No pain      Patients Stated Pain Goal: 7 (66/29/47 6546)  Complications: No notable events documented.

## 2021-02-13 NOTE — Anesthesia Preprocedure Evaluation (Signed)
Anesthesia Evaluation  Patient identified by MRN, date of birth, ID band Patient awake    Reviewed: Allergy & Precautions, H&P , NPO status , Patient's Chart, lab work & pertinent test results, reviewed documented beta blocker date and time   Airway Mallampati: II  TM Distance: >3 FB Neck ROM: full    Dental no notable dental hx.    Pulmonary neg pulmonary ROS,    Pulmonary exam normal breath sounds clear to auscultation       Cardiovascular Exercise Tolerance: Good negative cardio ROS   Rhythm:regular Rate:Normal     Neuro/Psych CVA negative psych ROS   GI/Hepatic Neg liver ROS, GERD  Medicated,  Endo/Other  negative endocrine ROS  Renal/GU negative Renal ROS  negative genitourinary   Musculoskeletal   Abdominal   Peds  Hematology negative hematology ROS (+)   Anesthesia Other Findings   Reproductive/Obstetrics negative OB ROS                             Anesthesia Physical Anesthesia Plan  ASA: 3  Anesthesia Plan: General   Post-op Pain Management:    Induction:   PONV Risk Score and Plan: Ondansetron  Airway Management Planned:   Additional Equipment:   Intra-op Plan:   Post-operative Plan:   Informed Consent: I have reviewed the patients History and Physical, chart, labs and discussed the procedure including the risks, benefits and alternatives for the proposed anesthesia with the patient or authorized representative who has indicated his/her understanding and acceptance.     Dental Advisory Given  Plan Discussed with: CRNA  Anesthesia Plan Comments:         Anesthesia Quick Evaluation

## 2021-02-13 NOTE — Op Note (Addendum)
[facility] Operative Note  02/13/21  Preoperative Diagnosis: Right inguinal hernia    Postoperative Diagnosis: Same   Procedure(s) Performed: Right inguinal hernia repair with mesh   Surgeon: Lanell Matar. Constance Haw, MD   Assistants: No qualified resident was available   Anesthesia: General endotracheal   Anesthesiologist: Dr. Briant Cedar  Specimens: None   Estimated Blood Loss: Minimal   Blood Replacement: None    Complications: None   Wound Class: Clean    Operative Indications: Mr. Wenke is a 55 yo with a right inguinal hernia that has been causing him some discomfort. We discussed risk of repair including recurrence, use of mesh, injury to the cord structures and nerve, and risk of bleeding and infection.   Findings:Right indirect hernia    Procedure: The patient was taken to the operating room and placed supine. General endotracheal anesthesia was induced. Intravenous antibiotics were  administered per protocol.  A time out was preformed verifying the correct patient, procedure, site, positioning and implants.  The right groin and scrotum were prepared and draped in the usual sterile fashion.   An incision was made in a natural skin crease between the pubic tubercle and the anterior superior iliac spine.  The incision was deepened with electrocautery through Scarpa's and Camper's fascia until the aponeurosis of the external oblique was encountered.  This was cleaned and the external ring was exposed.  An incision was made in the midportion of the external oblique aponeurosis in the direction of its fibers. The ilioinguinal nerve was identified and was protected throughout the dissection.  Flaps of the external oblique were developed cephalad and inferiorly.    The cord was identified and it was gently dissented free at the pubic tubercle and encircled with a Penrose drain.  Attention was then directed at the anteromedial aspect of the cord, where an indirect hernia  sac was identified.  The sac was carefully dissected free from the cord down to the level of the internal ring.  The vas and testicular vessels were identified and protected from harm.  Once the sac was dissected free from the cords, the Penrose was placed around the cord which was retracted inferiorly out of the field of view.  The hernia was reduced into the internal ring without difficulty.  A medium Perfix Plug was placed into the defect and filled the space.  This was secured to the internal ring with 2-0 Novafil. Attention was then turned to the floor of the canal, which was grossly weakened without any defined defect or sac.  The Perfix Mesh Patch was sutured to the inguinal ligament inferiorly starting at the pubic tubercle using 2-0 Novafil interrupted sutures.  The mesh was sutured superiorly to the conjoint tendon using 2-0 Novafil interrupted sutures.  Care was taken to ensure the mesh was placed in a relaxed fashion to avoid excessive tension and no neurovascular structures were caught in the repair.  Laterally the tails of the mesh were crossed and the internal ring was recreated, allowing for passage of cords without tension.   Hemostasis was adequate.  The Penrose was removed.  The external oblique aponeurosis was closed with a 2-0 Vicryl suture in a running fashion, taking care to not catch the ilioinguinal nerve in the suture line.  Scarpa's fashion was closed with 3-0 Vicryl interrupted sutures. The skin was closed with a subcuticular 4-0 Monocryl suture. Exparel was injected.  Dermabond was applied.   The testis was gently pulled down into its anatomic position in the  scrotum.  The patient tolerated the procedure well and was taken to the PACU in stable condition. All counts were correct at the end of the case.        Curlene Labrum, MD [facility] [address] [place], [place] [address] [phone] (office)

## 2021-02-13 NOTE — Anesthesia Postprocedure Evaluation (Signed)
Anesthesia Post Note  Patient: Andrew Ryan  Procedure(s) Performed: HERNIA REPAIR INGUINAL ADULT W/MESH (Right: Groin)  Patient location during evaluation: Phase II Anesthesia Type: General Level of consciousness: awake Pain management: pain level controlled Vital Signs Assessment: post-procedure vital signs reviewed and stable Respiratory status: spontaneous breathing and respiratory function stable Cardiovascular status: blood pressure returned to baseline and stable Postop Assessment: no headache and no apparent nausea or vomiting Anesthetic complications: no Comments: Late entry   No notable events documented.   Last Vitals:  Vitals:   02/13/21 0930 02/13/21 0945  BP: 108/77 (!) 128/92  Pulse: (!) 55 (!) 55  Resp: 12   Temp:  36.6 C  SpO2: 97% 98%    Last Pain:  Vitals:   02/13/21 0945  TempSrc: Oral  PainSc: 0-No pain                 Louann Sjogren

## 2021-02-14 ENCOUNTER — Encounter (HOSPITAL_COMMUNITY): Payer: Self-pay | Admitting: General Surgery

## 2021-02-19 ENCOUNTER — Ambulatory Visit: Payer: Self-pay | Admitting: Physician Assistant

## 2021-02-21 ENCOUNTER — Encounter: Payer: Self-pay | Admitting: Family Medicine

## 2021-03-05 ENCOUNTER — Ambulatory Visit: Payer: Self-pay | Admitting: Physician Assistant

## 2021-03-14 ENCOUNTER — Encounter: Payer: Self-pay | Admitting: General Surgery

## 2021-03-14 ENCOUNTER — Ambulatory Visit (INDEPENDENT_AMBULATORY_CARE_PROVIDER_SITE_OTHER): Payer: Managed Care, Other (non HMO) | Admitting: General Surgery

## 2021-03-14 ENCOUNTER — Other Ambulatory Visit: Payer: Self-pay

## 2021-03-14 VITALS — BP 120/82 | HR 68 | Temp 98.2°F | Resp 14 | Ht 74.0 in | Wt 248.0 lb

## 2021-03-14 DIAGNOSIS — K4090 Unilateral inguinal hernia, without obstruction or gangrene, not specified as recurrent: Secondary | ICD-10-CM

## 2021-03-14 NOTE — Progress Notes (Signed)
[facility]  Patient doing fair and healing from R Inguinal hernia. 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 Says he has blacked out.   Otherwise pain is ok and no redness or drainage. He is working with restrictions.   BP 120/82   Pulse 68   Temp 98.2 F (36.8 C) (Other (Comment))   Resp 14   Ht '6\' 2"'$  (1.88 m)   Wt 248 lb (112.5 kg)   SpO2 94%   BMI 31.84 kg/m  R inguinal hernia c/d/I without erythema or drainage Minor induration  Patient s/p R inguinal hernia repair with mesh.   No signs of Orthostatic hypotension on the BP today. Flat- 120/74, HR 66 Sitting 122/78, HR 67 Standing 140/80, HR 68  Unsure why dizziness is worse. Potentially could be from anesthesia but would think it would improve after 4 weeks. ? Vertigo. Would follow up with PCP to see if they can evaluate further.  No heavy lifting > 10 lbs, excessive bending, pushing, pulling, or squatting for 6-8 weeks after surgery.  Likely can return to work without restrictions 8/24 after we see him.   Future Appointments  Date Time Provider Ashley  04/09/2021 11:15 AM Virl Cagey, MD [facility] None  05/29/2021 11:45 AM Warren Danes, PA-C [facility] [place], MD [facility] [address] [place], [place] [address] [phone] (office)

## 2021-03-14 NOTE — Patient Instructions (Signed)
No signs of Orthostatic hypotension on the BP today. Flat- 120/74, HR 66 Sitting 122/78, HR 67 Standing 140/80, HR 68  Unsure why dizziness is worse. Potentially could be from anesthesia but would think it would improve after 4 weeks. ? Vertigo. Would follow up with PCP to see if they can evaluate further.  No heavy lifting > 10 lbs, excessive bending, pushing, pulling, or squatting for 6-8 weeks after surgery.

## 2021-04-09 ENCOUNTER — Other Ambulatory Visit: Payer: Self-pay

## 2021-04-09 ENCOUNTER — Encounter: Payer: Self-pay | Admitting: General Surgery

## 2021-04-09 ENCOUNTER — Ambulatory Visit (INDEPENDENT_AMBULATORY_CARE_PROVIDER_SITE_OTHER): Payer: Managed Care, Other (non HMO) | Admitting: General Surgery

## 2021-04-09 VITALS — BP 118/82 | HR 58 | Temp 98.7°F | Resp 16 | Ht 74.0 in | Wt 249.0 lb

## 2021-04-09 DIAGNOSIS — K4090 Unilateral inguinal hernia, without obstruction or gangrene, not specified as recurrent: Secondary | ICD-10-CM

## 2021-04-09 NOTE — Patient Instructions (Signed)
Ok to return to work without any restrictions 04/10/2021.

## 2021-04-11 NOTE — Progress Notes (Signed)
[facility]  Feeling well and healed. At work with restrictions now. Return to work without restrictions 8/24. No complaints.   BP 118/82   Pulse (!) 58   Temp 98.7 F (37.1 C) (Other (Comment))   Resp 16   Ht '6\' 2"'$  (1.88 m)   Wt 249 lb (112.9 kg)   SpO2 96%   BMI 31.97 kg/m  Healed right inguinal incision, no hernia  S/p R inguinal hernia repair with mesh. Healed.    Ok to return to work without any restrictions 04/10/2021.    Curlene Labrum, MD [facility] [address] [place], [place] [address] [phone] (office)

## 2021-05-29 ENCOUNTER — Ambulatory Visit: Payer: Self-pay | Admitting: Physician Assistant

## 2021-08-06 ENCOUNTER — Encounter: Payer: Self-pay | Admitting: Physician Assistant

## 2021-08-06 ENCOUNTER — Other Ambulatory Visit: Payer: Self-pay

## 2021-08-06 ENCOUNTER — Ambulatory Visit (INDEPENDENT_AMBULATORY_CARE_PROVIDER_SITE_OTHER): Payer: Managed Care, Other (non HMO) | Admitting: Physician Assistant

## 2021-08-06 DIAGNOSIS — L570 Actinic keratosis: Secondary | ICD-10-CM | POA: Diagnosis not present

## 2021-08-06 DIAGNOSIS — D485 Neoplasm of uncertain behavior of skin: Secondary | ICD-10-CM | POA: Diagnosis not present

## 2021-08-06 DIAGNOSIS — Z86018 Personal history of other benign neoplasm: Secondary | ICD-10-CM

## 2021-08-06 DIAGNOSIS — Z1283 Encounter for screening for malignant neoplasm of skin: Secondary | ICD-10-CM

## 2021-08-06 DIAGNOSIS — C44619 Basal cell carcinoma of skin of left upper limb, including shoulder: Secondary | ICD-10-CM

## 2021-08-06 DIAGNOSIS — B078 Other viral warts: Secondary | ICD-10-CM

## 2021-08-06 DIAGNOSIS — Z808 Family history of malignant neoplasm of other organs or systems: Secondary | ICD-10-CM

## 2021-08-06 DIAGNOSIS — Z85828 Personal history of other malignant neoplasm of skin: Secondary | ICD-10-CM | POA: Diagnosis not present

## 2021-08-06 NOTE — Progress Notes (Signed)
Follow-Up Visit   Subjective  Andrew Ryan is a 55 y.o. male who presents for the following: Annual Exam (Lesions on back right of head x year, no itching or bleeding. Lesion on top right head x year, no itching ,bleeding when scratched off. Lesion on both sides x 5 months, itching and bleeding. And lesion on stomach x 6 months. History of bcc, scc, and atypical moles. Family history of bcc and scc. ).   The following portions of the chart were reviewed this encounter and updated as appropriate:  Tobacco   Allergies   Meds   Problems   Med Hx   Surg Hx   Fam Hx       Objective  Well appearing patient in no apparent distress; mood and affect are within normal limits.  A full examination was performed including scalp, head, eyes, ears, nose, lips, neck, chest, axillae, abdomen, back, buttocks, bilateral upper extremities, bilateral lower extremities, hands, feet, fingers, toes, fingernails, and toenails. All findings within normal limits unless otherwise noted below.  Left Parietal Scalp White crust with central black discoloration.       Left Forearm - Posterior Pink plaque       Right Abdomen (side) - Lower Brown crust.     Left Buccal Cheek (3), Right Buccal Cheek (2) Erythematous patches with gritty scale.     Left Abdomen (side) - Upper, Right 2nd Finger Proximal Interphalangeal Joint, Right Anterior and posterior Neck Verrucous papules -- Discussed viral etiology and contagion.    Assessment & Plan  Neoplasm of uncertain behavior of skin (3) Left Parietal Scalp  Skin / nail biopsy Type of biopsy: tangential   Informed consent: discussed and consent obtained   Timeout: patient name, date of birth, surgical site, and procedure verified   Procedure prep:  Patient was prepped and draped in usual sterile fashion (Non sterile) Prep type:  Chlorhexidine Anesthesia: the lesion was anesthetized in a standard fashion   Anesthetic:  1% lidocaine w/ epinephrine  1-100,000 local infiltration Instrument used: flexible razor blade   Hemostasis achieved with: electrodesiccation   Outcome: patient tolerated procedure well   Post-procedure details: sterile dressing applied and wound care instructions given   Dressing type: bandage and petrolatum    Specimen 1 - Surgical pathology Differential Diagnosis: R/O Atypia - cautery after biopsy  Check Margins: Yes  Left Forearm - Posterior  Skin / nail biopsy Type of biopsy: tangential   Informed consent: discussed and consent obtained   Timeout: patient name, date of birth, surgical site, and procedure verified   Procedure prep:  Patient was prepped and draped in usual sterile fashion (Non sterile) Prep type:  Chlorhexidine Anesthesia: the lesion was anesthetized in a standard fashion   Anesthetic:  1% lidocaine w/ epinephrine 1-100,000 local infiltration Instrument used: flexible razor blade   Hemostasis achieved with: electrodesiccation   Outcome: patient tolerated procedure well   Post-procedure details: sterile dressing applied and wound care instructions given   Dressing type: bandage and petrolatum    Specimen 2 - Surgical pathology Differential Diagnosis: R/O BCC vs SCC - cautery after biopsy  Check Margins: Yes  Right Abdomen (side) - Lower  Skin / nail biopsy Type of biopsy: tangential   Informed consent: discussed and consent obtained   Timeout: patient name, date of birth, surgical site, and procedure verified   Procedure prep:  Patient was prepped and draped in usual sterile fashion (Non sterile) Prep type:  Chlorhexidine Anesthesia: the lesion was anesthetized in  a standard fashion   Anesthetic:  1% lidocaine w/ epinephrine 1-100,000 local infiltration Instrument used: flexible razor blade   Hemostasis achieved with: electrodesiccation   Outcome: patient tolerated procedure well   Post-procedure details: sterile dressing applied and wound care instructions given   Dressing  type: bandage and petrolatum    Specimen 3 - Surgical pathology Differential Diagnosis: R/O Wart - cautery after biopsy  Check Margins: No  AK (actinic keratosis) (5) Left Buccal Cheek (3); Right Buccal Cheek (2)  Destruction of lesion - Left Buccal Cheek, Right Buccal Cheek Complexity: simple   Destruction method: cryotherapy   Informed consent: discussed and consent obtained   Timeout:  patient name, date of birth, surgical site, and procedure verified Lesion destroyed using liquid nitrogen: Yes   Cryotherapy cycles:  3 Outcome: patient tolerated procedure well with no complications   Post-procedure details: wound care instructions given    Other viral warts (3) Left Abdomen (side) - Upper; Right 2nd Finger Proximal Interphalangeal Joint; Right Anterior and posterior Neck  Destruction of lesion - Left Abdomen (side) - Upper, Right 2nd Finger Proximal Interphalangeal Joint, Right Anterior and posterior Neck Complexity: simple   Destruction method: cryotherapy   Informed consent: discussed and consent obtained   Timeout:  patient name, date of birth, surgical site, and procedure verified Lesion destroyed using liquid nitrogen: Yes   Cryotherapy cycles:  1 Outcome: patient tolerated procedure well with no complications   Post-procedure details: wound care instructions given      I, Brylynn Hanssen, PA-C, have reviewed all documentation's for this visit.  The documentation on 08/06/21 for the exam, diagnosis, procedures and orders are all accurate and complete.

## 2021-08-06 NOTE — Patient Instructions (Signed)

## 2021-08-21 ENCOUNTER — Telehealth: Payer: Self-pay

## 2021-08-21 NOTE — Telephone Encounter (Signed)
-----   Message from Warren Danes, [place] sent at 08/21/2021  7:53 AM EST ----- 30

## 2021-08-21 NOTE — Telephone Encounter (Signed)
Patient called he needs 30 with kelli  Skin , left forearm - posterior SUPERFICIAL BASAL CELL CARCINOMA, BASE INVOLVED

## 2021-09-12 ENCOUNTER — Other Ambulatory Visit: Payer: Self-pay

## 2021-09-12 ENCOUNTER — Ambulatory Visit (AMBULATORY_SURGERY_CENTER): Payer: Managed Care, Other (non HMO)

## 2021-09-12 ENCOUNTER — Ambulatory Visit (INDEPENDENT_AMBULATORY_CARE_PROVIDER_SITE_OTHER): Payer: Managed Care, Other (non HMO) | Admitting: Physician Assistant

## 2021-09-12 ENCOUNTER — Encounter: Payer: Self-pay | Admitting: Physician Assistant

## 2021-09-12 VITALS — Ht 74.0 in | Wt 250.0 lb

## 2021-09-12 DIAGNOSIS — Z86010 Personal history of colonic polyps: Secondary | ICD-10-CM

## 2021-09-12 DIAGNOSIS — C44619 Basal cell carcinoma of skin of left upper limb, including shoulder: Secondary | ICD-10-CM | POA: Diagnosis not present

## 2021-09-12 NOTE — Patient Instructions (Signed)

## 2021-09-12 NOTE — Progress Notes (Signed)
No egg or soy allergy known to patient  No issues known to pt with past sedation with any surgeries or procedures Patient denies ever being told they had issues or difficulty with intubation  No FH of Malignant Hyperthermia Pt is not on diet pills Pt is not on home 02  Pt is not on blood thinners  Pt denies issues with constipation;  No A fib or A flutter Pt is fully vaccinated for Covid x 2; NO PA's for preps discussed with pt in PV today  Discussed with pt there will be an out-of-pocket cost for prep and that varies from $0 to 70 + dollars - pt verbalized understanding  Due to the COVID-19 pandemic we are asking patients to follow certain guidelines in PV and the [facility]   Pt aware of COVID protocols and [facility] guidelines  PV completed over the phone. Pt verified name, DOB, address and insurance during PV today.  Pt mailed instruction packet with copy of consent form to read and not return, and instructions.  Pt encouraged to call with questions or issues.  If pt has My chart, procedure instructions sent via My Chart

## 2021-09-16 ENCOUNTER — Encounter: Payer: Self-pay | Admitting: Physician Assistant

## 2021-09-16 NOTE — Progress Notes (Signed)
° °  Follow-Up Visit   Subjective  Andrew Ryan is a 56 y.o. male who presents for the following: Procedure (Here for treatment of left forearm BCC. ).   The following portions of the chart were reviewed this encounter and updated as appropriate:  Tobacco   Allergies   Meds   Problems   Med Hx   Surg Hx   Fam Hx       Objective  Well appearing patient in no apparent distress; mood and affect are within normal limits.  A focused examination was performed including upper extremities. Relevant physical exam findings are noted in the Assessment and Plan.  Left Forearm - Posterior Pearly macule  [id number]   Assessment & Plan  Basal cell carcinoma (BCC) of skin of left upper extremity including shoulder Left Forearm - Posterior  Destruction of lesion Complexity: simple   Destruction method: electrodesiccation and curettage   Informed consent: discussed and consent obtained   Timeout:  patient name, date of birth, surgical site, and procedure verified Anesthesia: the lesion was anesthetized in a standard fashion   Anesthetic:  1% lidocaine w/ epinephrine 1-100,000 local infiltration Curettage performed in three different directions: Yes   Curettage cycles:  3 Margin per side (cm):  0.1 Final wound size (cm):  1.5 Hemostasis achieved with:  aluminum chloride Outcome: patient tolerated procedure well with no complications   Post-procedure details: wound care instructions given      I, Issacc Merlo, PA-C, have reviewed all documentation's for this visit.  The documentation on 09/16/21 for the exam, diagnosis, procedures and orders are all accurate and complete.

## 2021-09-19 ENCOUNTER — Ambulatory Visit (AMBULATORY_SURGERY_CENTER): Payer: Managed Care, Other (non HMO) | Admitting: Internal Medicine

## 2021-09-19 ENCOUNTER — Encounter: Payer: Self-pay | Admitting: Internal Medicine

## 2021-09-19 VITALS — BP 111/72 | HR 14 | Temp 97.3°F | Resp 14 | Ht 74.0 in | Wt 250.0 lb

## 2021-09-19 DIAGNOSIS — D125 Benign neoplasm of sigmoid colon: Secondary | ICD-10-CM

## 2021-09-19 DIAGNOSIS — D120 Benign neoplasm of cecum: Secondary | ICD-10-CM

## 2021-09-19 DIAGNOSIS — Z86010 Personal history of colonic polyps: Secondary | ICD-10-CM | POA: Diagnosis not present

## 2021-09-19 DIAGNOSIS — D124 Benign neoplasm of descending colon: Secondary | ICD-10-CM | POA: Diagnosis not present

## 2021-09-19 DIAGNOSIS — D123 Benign neoplasm of transverse colon: Secondary | ICD-10-CM | POA: Diagnosis not present

## 2021-09-19 DIAGNOSIS — D122 Benign neoplasm of ascending colon: Secondary | ICD-10-CM | POA: Diagnosis not present

## 2021-09-19 MED ORDER — SODIUM CHLORIDE 0.9 % IV SOLN: 500.0000 mL | Freq: Once | INTRAVENOUS | Status: DC

## 2021-09-19 NOTE — Op Note (Signed)
[facility] Patient Name: Andrew Ryan Procedure Date: 09/19/2021 2:46 PM MRN: [id number] Endoscopist: Gatha Mayer , MD Age: 56 Referring MD:  Date of Birth: July 04, 1966 Gender: Male Account #: [id number] Procedure:                Colonoscopy Indications:              Surveillance: Personal history of adenomatous                            polyps on last colonoscopy > 3 years ago, Last                            colonoscopy: December 2018 Medicines:                Propofol per Anesthesia, Monitored Anesthesia Care Procedure:                Pre-Anesthesia Assessment:                           - Prior to the procedure, a History and Physical                            was performed, and patient medications and                            allergies were reviewed. The patient's tolerance of                            previous anesthesia was also reviewed. The risks                            and benefits of the procedure and the sedation                            options and risks were discussed with the patient.                            All questions were answered, and informed consent                            was obtained. Prior Anticoagulants: The patient has                            taken no previous anticoagulant or antiplatelet                            agents. ASA Grade Assessment: III - A patient with                            severe systemic disease. After reviewing the risks                            and benefits, the patient was deemed in  satisfactory condition to undergo the procedure.                           After obtaining informed consent, the colonoscope                            was passed under direct vision. Throughout the                            procedure, the patient's blood pressure, pulse, and                            oxygen saturations were monitored continuously. The                            Olympus  [id number]-HQ190L [id number]) Colonoscope was                            introduced through the anus and advanced to the the                            cecum, identified by appendiceal orifice and                            ileocecal valve. The colonoscopy was performed                            without difficulty. The patient tolerated the                            procedure well. The quality of the bowel                            preparation was adequate. The ileocecal valve,                            appendiceal orifice, and rectum were photographed.                            The bowel preparation used was Miralax via split                            dose instruction. Scope In: 3:04:37 PM Scope Out: 3:56:23 PM Scope Withdrawal Time: 0 hours 47 minutes 48 seconds  Total Procedure Duration: 0 hours 51 minutes 46 seconds  Findings:                 The perianal and digital rectal examinations were                            normal.                           Two sessile polyps were found in the ascending  colon and cecum. The polyps were 15 to 25 mm in                            size. These polyps were removed with a piecemeal                            technique using a hot snare. Resection and                            retrieval were complete. Verification of patient                            identification for the specimen was done. Estimated                            blood loss: none. Area was tattooed with an                            injection of 3 mL of Spot (carbon black).                           Seven sessile polyps were found in the sigmoid                            colon, descending colon, transverse colon,                            ascending colon and cecum. The polyps were 2 to 5                            mm in size. These polyps were removed with a cold                            snare. Resection and retrieval were complete.                             Verification of patient identification for the                            specimen was done. Estimated blood loss was minimal.                           The exam was otherwise without abnormality on                            direct and retroflexion views. Complications:            No immediate complications. Estimated Blood Loss:     Estimated blood loss was minimal. Impression:               - Two 15 to 25 mm polyps in the ascending colon and  in the cecum, removed piecemeal using a hot snare.                            Resected and retrieved. Tattooed.                           - Seven 2 to 5 mm polyps in the sigmoid colon, in                            the descending colon, in the transverse colon, in                            the ascending colon and in the cecum, removed with                            a cold snare. Resected and retrieved.                           - The examination was otherwise normal on direct                            and retroflexion views.                           - Personal history of colonic polyps. 12/18 6                            adenomas 15 mm max Recommendation:           - Patient has a contact number available for                            emergencies. The signs and symptoms of potential                            delayed complications were discussed with the                            patient. Return to normal activities tomorrow.                            Written discharge instructions were provided to the                            patient.                           - Resume previous diet.                           - Continue present medications.                           - No aspirin, ibuprofen, naproxen, or other  non-steroidal anti-inflammatory drugs for 2 weeks                            after polyp removal.                           - Await pathology results.                            - Repeat colonoscopy is recommended. The                            colonoscopy date will be determined after pathology                            results from today's exam become available for                            review. Consider genetics eval (BCC hx also) Gatha Mayer, MD 09/19/2021 4:08:28 PM This report has been signed electronically.

## 2021-09-19 NOTE — Progress Notes (Signed)
[facility] History and Physical   Primary Care Physician:  Emelda Fear, DO   Reason for Procedure:   Hx colon polyps  Plan:    colonoscopy     HPI: Andrew Ryan is a 56 y.o. male here for polyp surveillance  2018 6 adenomas max 15 mm  Past Medical History:  Diagnosis Date   Arthritis    Atypical nevus 05/19/2008   Right Medial Lower Abdomen-Moderate (Dr. Waldon Merl) and Right Lateral Mid Abdomen-Moderate to Marked (Dr. Waldon Merl)   Atypical nevus 05/28/2010   Right Lateral Abd-Moderate, Right Upper Medial Arm-Mild and Left Lateral Abdomen-Mild (Dr. Waldon Merl)   [place] (basal cell carcinoma of skin) 05/19/2008   Left Medial Shoulder (Dr. Waldon Merl)   [place] (basal cell carcinoma of skin) 02/03/2011   Above Right Ear (MOH's)   BCC (basal cell carcinoma of skin) 06/30/2011   Left Upper Back (Cx3,5FU) and Upper Mid Back (Cx3,5FU)   BCC (basal cell carcinoma of skin) 03/15/2014   Right Forearm (tx p bx)   GERD (gastroesophageal reflux disease)    on meds   H. pylori infection    Histoplasmosis    Hx of adenomatous colonic polyps 07/29/2017   Seasonal allergies    Skin cancer, basal cell    Squamous cell skin cancer    Stroke ([place]) 09/2016   Superficial basal cell carcinoma (BCC) 05/19/2008   Left Lateral Shoulder, and Left Lateral Neck (Dr. Waldon Merl)    Past Surgical History:  Procedure Laterality Date   COLONOSCOPY  2018   [facility]-[place](exc)-TA   CYSTOSCOPY  2015   INGUINAL HERNIA REPAIR Right 02/13/2021   Procedure: HERNIA REPAIR INGUINAL ADULT W/MESH;  Surgeon: Virl Cagey, MD;  Location: [facility];  Service: General;  Laterality: Right;   INGUINAL HERNIA REPAIR Left 1991   KNEE ARTHROSCOPY Bilateral    POLYPECTOMY  6712   TA   UMBILICAL HERNIA REPAIR     UPPER GASTROINTESTINAL ENDOSCOPY      Prior to Admission medications   Medication Sig Start Date End Date Taking? Authorizing Provider  ASPIRIN 81 PO Take 81 mg by mouth daily.   Yes [provider]  atorvastatin (LIPITOR) 20 MG tablet Take 20 mg by mouth daily. 05/11/17  Yes [provider]  cetirizine (ZYRTEC) 10 MG tablet Take 10 mg by mouth daily.   Yes [provider]  escitalopram (LEXAPRO) 10 MG tablet Take 10 mg by mouth daily.   Yes [provider]  Misc Natural Products (OSTEO BI-FLEX ADV JOINT SHIELD PO) Take 2 tablets by mouth daily.   Yes [provider]  Multiple Vitamins-Minerals (MULTIVITAMIN WITH MINERALS) tablet Take 1 tablet by mouth daily.   Yes [provider]  omeprazole (PRILOSEC) 40 MG capsule Take 40 mg by mouth daily. 06/08/15  Yes [provider]    Current Outpatient Medications  Medication Sig Dispense Refill   ASPIRIN 81 PO Take 81 mg by mouth daily.     atorvastatin (LIPITOR) 20 MG tablet Take 20 mg by mouth daily.  6   cetirizine (ZYRTEC) 10 MG tablet Take 10 mg by mouth daily.     escitalopram (LEXAPRO) 10 MG tablet Take 10 mg by mouth daily.     Misc Natural Products (OSTEO BI-FLEX ADV JOINT SHIELD PO) Take 2 tablets by mouth daily.     Multiple Vitamins-Minerals (MULTIVITAMIN WITH MINERALS) tablet Take 1 tablet by mouth daily.     omeprazole (PRILOSEC) 40 MG capsule Take 40 mg by mouth daily.  Current Facility-Administered Medications  Medication Dose Route Frequency Provider Last Rate Last Admin   0.9 %  sodium chloride infusion  500 mL Intravenous Once Gatha Mayer, MD        Allergies as of 09/19/2021   (No Known Allergies)    Family History  Problem Relation Age of Onset   Congestive Heart Failure Father    COPD Father    Lung cancer Maternal Grandfather        smoker, coal miner   Heart attack Maternal Grandfather    Lung cancer Paternal Grandfather        smoker, Ecologist   Colon cancer Neg Hx    Esophageal cancer Neg Hx    Stomach cancer Neg Hx    Colon polyps Neg Hx    Rectal cancer Neg Hx     Social History   Socioeconomic History   Marital  status: Married    Spouse name: Not on file   Number of children: 3   Years of education: Not on file   Highest education level: Not on file  Occupational History   Occupation: Animator  Tobacco Use   Smoking status: Never   Smokeless tobacco: Never  Vaping Use   Vaping Use: Never used  Substance and Sexual Activity   Alcohol use: Not Currently    Alcohol/week: 2.0 standard drinks    Types: 2 Standard drinks or equivalent per week    Comment: occ   Drug use: No   Sexual activity: Not on file  Other Topics Concern   Not on file  Social History Narrative   Married, 3 daughters   He is an Engineer, 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  Physical Exam: Vital signs BP 132/82    Pulse 64    Temp (!) 97.3 F (36.3 C)    Ht 6\' 2"  (1.88 m)    Wt 250 lb (113.4 kg)    SpO2 98%    BMI 32.10 kg/m   General:   Alert,  Well-developed, well-nourished, pleasant and cooperative in NAD Lungs:  Clear throughout to auscultation.   Heart:  Regular rate and rhythm; no murmurs, clicks, rubs,  or gallops. Abdomen:  Soft, nontender and nondistended. Normal bowel sounds.   Neuro/Psych:  Alert and cooperative. Normal mood and affect. A and O x 3   @Josiephine Simao  Simonne Maffucci, MD, [facility] [phone] (pager) 09/19/2021 2:52 PM@

## 2021-09-19 NOTE — Progress Notes (Signed)
Pt's states no medical or surgical changes since previsit or office visit. 

## 2021-09-19 NOTE — Progress Notes (Signed)
D.T. vital signs. °

## 2021-09-19 NOTE — Patient Instructions (Addendum)
9 polyps removed. 2 were large. I think all benign.  You will need another colonoscopy in 3-6 months to recheck the 2 large polyp removal sites to be sure all gone, unless something in pathology indicates need for surgery. I doubt that but need to see pathology.  I appreciate the opportunity to care for you. Gatha Mayer, MD, FACG   NO ASPIRIN, ASPIRIN CONTAINING PRODUCTS (BC POWDERS, GOODY POWDERS), NSAIDS (IBUPROFEN, ADVIL, MOTRIN) FOR 2 WEEKS, TYLENOL IS OK TO TAKE  CONTINUE YOUR NORMAL MEDICATIONS  PLEASE READ OVER HANDOUT ABOUT POLYPS  YOU HAD AN ENDOSCOPIC PROCEDURE TODAY AT [facility]:   Refer to the procedure report that was given to you for any specific questions about what was found during the examination.  If the procedure report does not answer your questions, please call your gastroenterologist to clarify.  If you requested that your care partner not be given the details of your procedure findings, then the procedure report has been included in a sealed envelope for you to review at your convenience later.  YOU SHOULD EXPECT: Some feelings of bloating in the abdomen. Passage of more gas than usual.  Walking can help get rid of the air that was put into your GI tract during the procedure and reduce the bloating. If you had a lower endoscopy (such as a colonoscopy or flexible sigmoidoscopy) you may notice spotting of blood in your stool or on the toilet paper. If you underwent a bowel prep for your procedure, you may not have a normal bowel movement for a few days.  Please Note:  You might notice some irritation and congestion in your nose or some drainage.  This is from the oxygen used during your procedure.  There is no need for concern and it should clear up in a day or so.  SYMPTOMS TO REPORT IMMEDIATELY:  Following lower endoscopy (colonoscopy or flexible sigmoidoscopy):  Excessive amounts of blood in the stool  Significant tenderness or worsening of  abdominal pains  Swelling of the abdomen that is new, acute  Fever of 100F or higher  For urgent or emergent issues, a gastroenterologist can be reached at any hour by calling [phone]. Do not use MyChart messaging for urgent concerns.    DIET:  We do recommend a small meal at first, but then you may proceed to your regular diet.  Drink plenty of fluids but you should avoid alcoholic beverages for 24 hours.  ACTIVITY:  You should plan to take it easy for the rest of today and you should NOT DRIVE or use heavy machinery until tomorrow (because of the sedation medicines used during the test).    FOLLOW UP: Our staff will call the number listed on your records 48-72 hours following your procedure to check on you and address any questions or concerns that you may have regarding the information given to you following your procedure. If we do not reach you, we will leave a message.  We will attempt to reach you two times.  During this call, we will ask if you have developed any symptoms of COVID 19. If you develop any symptoms (ie: fever, flu-like symptoms, shortness of breath, cough etc.) before then, please call [phone].  If you test positive for Covid 19 in the 2 weeks post procedure, please call and report this information to [place].    If any biopsies were taken you will be contacted by phone or by letter within the next 1-3 weeks.  Please call us at [phone] if you have not heard about the biopsies in 3 weeks.    SIGNATURES/CONFIDENTIALITY: You and/or your care partner have signed paperwork which will be entered into your electronic medical record.  These signatures attest to the fact that that the information above on your After Visit Summary has been reviewed and is understood.  Full responsibility of the confidentiality of this discharge information lies with you and/or your care-partner.

## 2021-09-19 NOTE — Progress Notes (Signed)
PT taken to PACU. Monitors in place. VSS. Report given to RN. 

## 2021-09-23 ENCOUNTER — Telehealth: Payer: Self-pay

## 2021-09-23 NOTE — Telephone Encounter (Signed)
°  Follow up Call-  Call back number 09/19/2021  Post procedure Call Back phone  # [phone]  Permission to leave phone message Yes  Some recent data might be hidden     Patient questions:  Do you have a fever, pain , or abdominal swelling? No. Pain Score  0 *  Have you tolerated food without any problems? Yes.    Have you been able to return to your normal activities? Yes.    Do you have any questions about your discharge instructions: Diet   No. Medications  No. Follow up visit  No.  Do you have questions or concerns about your Care? No.  Actions: * If pain score is 4 or above: No action needed, pain <4.  Have you developed a fever since your procedure? no  2.   Have you had an respiratory symptoms (SOB or cough) since your procedure? no  3.   Have you tested positive for COVID 19 since your procedure no  4.   Have you had any family members/close contacts diagnosed with the COVID 19 since your procedure?  no   If yes to any of these questions please route to Joylene John, RN and Joella Prince, RN

## 2021-10-02 ENCOUNTER — Encounter: Payer: Self-pay | Admitting: Internal Medicine

## 2021-10-02 DIAGNOSIS — Z86010 Personal history of colonic polyps: Secondary | ICD-10-CM

## 2021-12-11 ENCOUNTER — Ambulatory Visit: Payer: Managed Care, Other (non HMO) | Admitting: Physician Assistant

## 2021-12-17 ENCOUNTER — Ambulatory Visit: Payer: Managed Care, Other (non HMO) | Admitting: Physician Assistant

## 2022-01-08 ENCOUNTER — Ambulatory Visit (INDEPENDENT_AMBULATORY_CARE_PROVIDER_SITE_OTHER): Payer: Managed Care, Other (non HMO) | Admitting: Physician Assistant

## 2022-01-08 DIAGNOSIS — L820 Inflamed seborrheic keratosis: Secondary | ICD-10-CM

## 2022-01-08 DIAGNOSIS — Z85828 Personal history of other malignant neoplasm of skin: Secondary | ICD-10-CM

## 2022-01-08 DIAGNOSIS — Z86018 Personal history of other benign neoplasm: Secondary | ICD-10-CM

## 2022-01-08 DIAGNOSIS — Z1283 Encounter for screening for malignant neoplasm of skin: Secondary | ICD-10-CM

## 2022-01-08 MED ORDER — MUPIROCIN 2 % EX OINT: 1.0000 "application " | TOPICAL_OINTMENT | Freq: Two times a day (BID) | CUTANEOUS | 6 refills | Status: AC

## 2022-01-15 ENCOUNTER — Encounter: Payer: Self-pay | Admitting: Physician Assistant

## 2022-01-15 NOTE — Progress Notes (Signed)
   Follow-Up Visit   Subjective  Andrew Ryan is a 56 y.o. male who presents for the following: Follow-up (3 month follow up. BCC left forearm. Also wants the back of left leg and side of left scalp checked. Also check right side neck. [name] of non mole skin cancer. History of atypical moles. ).   The following portions of the chart were reviewed this encounter and updated as appropriate:  Tobacco  Allergies  Meds  Problems  Med Hx  Surg Hx  Fam Hx      Objective  Well appearing patient in no apparent distress; mood and affect are within normal limits.  All skin waist up examined.  Full body skin examination -No atypical nevi or signs of NMSC noted at the time of the visit.   Left Forehead (4), Mid Forehead (5), Right Forehead (5), Torso - Posterior (Back) (5) Brown and skin toned crusts on erythematous base.    Assessment & Plan  Encounter for screening for malignant neoplasm of skin  Yearly skin exams  Seborrheic keratosis, inflamed (19) Torso - Posterior (Back) (5); Mid Forehead (5); Left Forehead (4); Right Forehead (5)  Destruction of lesion - Left Forehead, Mid Forehead, Right Forehead, Torso - Posterior (Back) Complexity: simple   Destruction method: cryotherapy   Informed consent: discussed and consent obtained   Timeout:  patient name, date of birth, surgical site, and procedure verified Lesion destroyed using liquid nitrogen: Yes   Cryotherapy cycles:  3 Outcome: patient tolerated procedure well with no complications    mupirocin ointment (BACTROBAN) 2 % - Left Forehead, Mid Forehead, Right Forehead, Torso - Posterior (Back) Apply 1 application. topically 2 (two) times daily.  Left forearm was clear at the time of the visit.   I, Trask Vosler, PA-C, have reviewed all documentation's for this visit.  The documentation on 01/15/22 for the exam, diagnosis, procedures and orders are all accurate and complete.

## 2022-05-05 ENCOUNTER — Encounter: Payer: Self-pay | Admitting: Internal Medicine

## 2023-03-30 ENCOUNTER — Encounter: Payer: Self-pay | Admitting: Internal Medicine

## 2023-05-13 ENCOUNTER — Encounter: Payer: Self-pay | Admitting: Internal Medicine

## 2023-05-13 ENCOUNTER — Ambulatory Visit (AMBULATORY_SURGERY_CENTER): Payer: Managed Care, Other (non HMO) | Admitting: *Deleted

## 2023-05-13 ENCOUNTER — Encounter: Payer: Managed Care, Other (non HMO) | Admitting: Internal Medicine

## 2023-05-13 VITALS — Ht 74.0 in | Wt 260.0 lb

## 2023-05-13 DIAGNOSIS — Z86010 Personal history of colonic polyps: Secondary | ICD-10-CM

## 2023-05-13 MED ORDER — NA SULFATE-K SULFATE-MG SULF 17.5-3.13-1.6 GM/177ML PO SOLN
1.0000 | Freq: Once | ORAL | 0 refills | Status: AC
Start: 2023-05-13 — End: 2023-05-13

## 2023-05-13 NOTE — Progress Notes (Signed)

## 2023-05-19 ENCOUNTER — Telehealth: Payer: Self-pay | Admitting: Internal Medicine

## 2023-05-19 NOTE — Telephone Encounter (Signed)
Spoke with patient and informed him that the skin CA  ( small area on top of head) removal should not interfere with his scheduled procedure with [facility].  Pt  did not have any other questions at this time.

## 2023-05-19 NOTE — Telephone Encounter (Signed)
Inbound call from patient, states he is having a local procedure on 10/4 to remove skin cancer, patient has procedure scheduled for 10/7, he is wanting to verify that procedure will not cause any issues with his colonoscopy. Please advise.

## 2023-05-22 HISTORY — PX: BASAL CELL CARCINOMA EXCISION: SHX1214

## 2023-05-25 ENCOUNTER — Ambulatory Visit (AMBULATORY_SURGERY_CENTER): Payer: Managed Care, Other (non HMO) | Admitting: Internal Medicine

## 2023-05-25 ENCOUNTER — Encounter: Payer: Self-pay | Admitting: Internal Medicine

## 2023-05-25 VITALS — BP 116/78 | HR 52 | Temp 98.5°F | Resp 17 | Ht 74.0 in | Wt 260.0 lb

## 2023-05-25 DIAGNOSIS — D127 Benign neoplasm of rectosigmoid junction: Secondary | ICD-10-CM | POA: Diagnosis not present

## 2023-05-25 DIAGNOSIS — Z860101 Personal history of adenomatous and serrated colon polyps: Secondary | ICD-10-CM | POA: Diagnosis not present

## 2023-05-25 DIAGNOSIS — D122 Benign neoplasm of ascending colon: Secondary | ICD-10-CM

## 2023-05-25 DIAGNOSIS — D128 Benign neoplasm of rectum: Secondary | ICD-10-CM | POA: Diagnosis not present

## 2023-05-25 DIAGNOSIS — D125 Benign neoplasm of sigmoid colon: Secondary | ICD-10-CM

## 2023-05-25 DIAGNOSIS — D123 Benign neoplasm of transverse colon: Secondary | ICD-10-CM

## 2023-05-25 DIAGNOSIS — Z09 Encounter for follow-up examination after completed treatment for conditions other than malignant neoplasm: Secondary | ICD-10-CM

## 2023-05-25 DIAGNOSIS — D124 Benign neoplasm of descending colon: Secondary | ICD-10-CM

## 2023-05-25 MED ORDER — SODIUM CHLORIDE 0.9 % IV SOLN: 500.0000 mL | INTRAVENOUS | Status: DC

## 2023-05-25 NOTE — Op Note (Signed)
[facility] Patient Name: Andrew Ryan Procedure Date: 05/25/2023 1:23 PM MRN: [id number] Endoscopist: Iva Boop , MD, [id number] Age: 57 Referring MD:  Date of Birth: 1966-07-25 Gender: Male Account #: [id number] Procedure:                Colonoscopy Indications:              Surveillance: Piecemeal removal of large sessile                            adenoma last colonoscopy (< 3 yrs), Last                            colonoscopy: February 2023 Medicines:                Monitored Anesthesia Care Procedure:                Pre-Anesthesia Assessment:                           - Prior to the procedure, a History and Physical                            was performed, and patient medications and                            allergies were reviewed. The patient's tolerance of                            previous anesthesia was also reviewed. The risks                            and benefits of the procedure and the sedation                            options and risks were discussed with the patient.                            All questions were answered, and informed consent                            was obtained. Prior Anticoagulants: The patient has                            taken no anticoagulant or antiplatelet agents. ASA                            Grade Assessment: II - A patient with mild systemic                            disease. After reviewing the risks and benefits,                            the patient was deemed in satisfactory condition to  undergo the procedure.                           After obtaining informed consent, the colonoscope                            was passed under direct vision. Throughout the                            procedure, the patient's blood pressure, pulse, and                            oxygen saturations were monitored continuously. The                            Olympus CF-HQ190L [id number])  Colonoscope was                            introduced through the anus and advanced to the the                            cecum, identified by appendiceal orifice and                            ileocecal valve. The colonoscopy was performed                            without difficulty. The patient tolerated the                            procedure well. The quality of the bowel                            preparation was good. The ileocecal valve,                            appendiceal orifice, and rectum were photographed.                            The bowel preparation used was SUPREP via split                            dose instruction. Scope In: 1:29:20 PM Scope Out: 1:46:37 PM Scope Withdrawal Time: 0 hours 14 minutes 33 seconds  Total Procedure Duration: 0 hours 17 minutes 17 seconds  Findings:                 The perianal and digital rectal examinations were                            normal.                           Six sessile polyps were found in the rectum,  sigmoid colon, descending colon, transverse colon                            and ascending colon. The polyps were diminutive in                            size. These polyps were removed with a cold snare.                            Resection and retrieval were complete. Verification                            of patient identification for the specimen was                            done. Estimated blood loss was minimal.                           A tattoo was seen in the ascending colon.                           The exam was otherwise without abnormality on                            direct and retroflexion views. Complications:            No immediate complications. Estimated Blood Loss:     Estimated blood loss was minimal. Impression:               - Six diminutive polyps in the rectum, in the                            sigmoid colon, in the descending colon, in the                             transverse colon and in the ascending colon,                            removed with a cold snare. Resected and retrieved.                           - A tattoo was seen in the ascending colon. no                            signs of resuidual large sessile cecal and                            ascending polyps removed 09/2021                           - The examination was otherwise normal on direct                            and retroflexion views.                           -  Personal history of colonic polyps. 07/2017 6                            adenomas max 15 mm \                           September 19, 2021 - Two 15 to 25 mm polyps in the                            ascending colon and in the cecum, removed piecemeal                            using                           a hot snare. Resected and retrieved. Tattooed.                           - Seven 2 to 5 mm polyps in the sigmoid colon, in                            the descending colon, in the transverse                           colon, in the ascending colon and in the cecum,                            removed with a cold snare. Resected and                           retrieved. Polyps were adenomatous, tubulovillous                            adenomas and sessile serrated polyps Recommendation:           - Patient has a contact number available for                            emergencies. The signs and symptoms of potential                            delayed complications were discussed with the                            patient. Return to normal activities tomorrow.                            Written discharge instructions were provided to the                            patient.                           - Resume previous diet.                           -  Continue present medications.                           - Repeat colonoscopy is recommended for                            surveillance. The colonoscopy date will be                             determined after pathology results from today's                            exam become available for review. Iva Boop, MD 05/25/2023 1:58:01 PM This report has been signed electronically.

## 2023-05-25 NOTE — Progress Notes (Signed)
Uneventful anesthetic. Report to pacu rn. Vss. Care resumed by rn. 

## 2023-05-25 NOTE — Progress Notes (Signed)
Pt's states no medical or surgical changes since previsit or office visit. 

## 2023-05-25 NOTE — Progress Notes (Signed)
[facility] History and Physical   Primary Care Physician:  Lorelei Pont, DO   Reason for Procedure:   Hx polyps  Plan:    colonoscopy     HPI: Andrew Ryan is a 57 y.o. male s/p colonoscopy in 2023 and 2018 as below:  - Two 15 to 25 mm polyps in the ascending colon and                            in the cecum, removed piecemeal using a hot snare.                            Resected and retrieved. Tattooed.                           - Seven 2 to 5 mm polyps in the sigmoid colon, in                            the descending colon, in the transverse colon, in                            the ascending colon and in the cecum, removed with                            a cold snare. Resected and retrieved.                           - The examination was otherwise normal on direct                            and retroflexion views.Polyps were adenomatous, tubulovillous adenomas and sessile serrated polyps  F/u had been planned for 2023                             - Personal history of colonic polyps. 12/18 6                            adenomas 15 mm max  Past Medical History:  Diagnosis Date   Allergy    seasonal   Arthritis    Atypical nevus 05/19/2008   Right Medial Lower Abdomen-Moderate (Dr. Wyvonnia Lora) and Right Lateral Mid Abdomen-Moderate to Marked (Dr. Wyvonnia Lora)   Atypical nevus 05/28/2010   Right Lateral Abd-Moderate, Right Upper Medial Arm-Mild and Left Lateral Abdomen-Mild (Dr. Wyvonnia Lora)   BCC (basal cell carcinoma of skin) 05/19/2008   Left Medial Shoulder (Dr. Wyvonnia Lora)   BCC (basal cell carcinoma of skin) 02/03/2011   Above Right Ear (MOH's)   BCC (basal cell carcinoma of skin) 06/30/2011   Left Upper Back (Cx3,5FU) and Upper Mid Back (Cx3,5FU)   BCC (basal cell carcinoma of skin) 03/15/2014   Right Forearm (tx p bx)   GERD (gastroesophageal reflux disease)    on meds   H. pylori infection    Histoplasmosis    Hx of adenomatous colonic polyps  07/29/2017   Seasonal allergies    Skin cancer, basal cell    Squamous cell skin cancer    Stroke (HCC) 09/2016  Superficial basal cell carcinoma (BCC) 05/19/2008   Left Lateral Shoulder, and Left Lateral Neck (Dr. Wyvonnia Lora)    Past Surgical History:  Procedure Laterality Date   BASAL CELL CARCINOMA EXCISION  05/22/2023   COLONOSCOPY  2018   CG-Mira(exc)-TA   CYSTOSCOPY  2015   INGUINAL HERNIA REPAIR Right 02/13/2021   Procedure: HERNIA REPAIR INGUINAL ADULT W/MESH;  Surgeon: Lucretia Roers, MD;  Location: [facility];  Service: General;  Laterality: Right;   INGUINAL HERNIA REPAIR Left 1991   KNEE ARTHROSCOPY Bilateral    POLYPECTOMY  2018   TA   UMBILICAL HERNIA REPAIR     UPPER GASTROINTESTINAL ENDOSCOPY      Prior to Admission medications   Medication Sig Start Date End Date Taking? Authorizing Provider  ASPIRIN 81 PO Take 81 mg by mouth daily.   Yes [provider]  atorvastatin (LIPITOR) 20 MG tablet Take 20 mg by mouth daily. 05/11/17  Yes [provider]  cetirizine (ZYRTEC) 10 MG tablet Take 10 mg by mouth daily.   Yes [provider]  escitalopram (LEXAPRO) 20 MG tablet Take 1 tablet every day by oral route for 90 days. 03/27/23  Yes [provider]  Misc Natural Products (OSTEO BI-FLEX ADV JOINT SHIELD PO) Take 2 tablets by mouth daily.   Yes [provider]  Multiple Vitamins-Minerals (MULTIVITAMIN WITH MINERALS) tablet Take 1 tablet by mouth daily. Multi pack with 8 different vitamins   Yes [provider]  omeprazole (PRILOSEC) 40 MG capsule Take 40 mg by mouth daily. 06/08/15  Yes [provider]  mupirocin ointment (BACTROBAN) 2 % Apply 1 application. topically 2 (two) times daily. Patient not taking: Reported on 05/25/2023 01/08/22   Glyn Ade, PA-C    Current Outpatient Medications  Medication Sig Dispense Refill   ASPIRIN 81 PO Take 81 mg by mouth daily.     atorvastatin (LIPITOR) 20 MG  tablet Take 20 mg by mouth daily.  6   cetirizine (ZYRTEC) 10 MG tablet Take 10 mg by mouth daily.     escitalopram (LEXAPRO) 20 MG tablet Take 1 tablet every day by oral route for 90 days.     Misc Natural Products (OSTEO BI-FLEX ADV JOINT SHIELD PO) Take 2 tablets by mouth daily.     Multiple Vitamins-Minerals (MULTIVITAMIN WITH MINERALS) tablet Take 1 tablet by mouth daily. Multi pack with 8 different vitamins     omeprazole (PRILOSEC) 40 MG capsule Take 40 mg by mouth daily.     mupirocin ointment (BACTROBAN) 2 % Apply 1 application. topically 2 (two) times daily. (Patient not taking: Reported on 05/25/2023) 22 g 6   Current Facility-Administered Medications  Medication Dose Route Frequency Provider Last Rate Last Admin   0.9 %  sodium chloride infusion  500 mL Intravenous Continuous Iva Boop, MD        Allergies as of 05/25/2023   (No Known Allergies)    Family History  Problem Relation Age of Onset   Congestive Heart Failure Father    COPD Father    Lung cancer Maternal Grandfather        smoker, coal miner   Heart attack Maternal Grandfather    Lung cancer Paternal Grandfather        smoker, Forensic scientist   Colon cancer Neg Hx    Esophageal cancer Neg Hx    Stomach cancer Neg Hx    Colon polyps Neg Hx    Rectal cancer Neg Hx  Crohn's disease Neg Hx    Ulcerative colitis Neg Hx     Social History   Socioeconomic History   Marital status: Married    Spouse name: Not on file   Number of children: 3   Years of education: Not on file   Highest education level: Not on file  Occupational History   Occupation: electrical/instrumentation tech  Tobacco Use   Smoking status: Never   Smokeless tobacco: Never  Vaping Use   Vaping status: Never Used  Substance and Sexual Activity   Alcohol use: Not Currently    Alcohol/week: 2.0 standard drinks of alcohol    Types: 2 Standard drinks or equivalent per week    Comment: occ   Drug use: No   Sexual activity: Not on  file  Other Topics Concern   Not on file  Social History Narrative   Married, 3 daughters   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  Physical Exam: Vital signs BP 125/83   Pulse 62   Temp 98.5 F (36.9 C) (Temporal)   Ht 6\' 2"  (1.88 m)   Wt 260 lb (117.9 kg)   SpO2 97%   BMI 33.38 kg/m   General:   Alert,  Well-developed, well-nourished, pleasant and cooperative in NAD Lungs:  Clear throughout to auscultation.   Heart:  Regular rate and rhythm; no murmurs, clicks, rubs,  or gallops. Abdomen:  Soft, nontender and nondistended. Normal bowel sounds.   Neuro/Psych:  Alert and cooperative. Normal mood and affect. A and O x 3   @Anthonny Schiller  Sena Slate, MD, [facility] [phone] (pager) 05/25/2023 1:23 PM@

## 2023-05-25 NOTE — Patient Instructions (Addendum)
6 tiny polyps removed today. I will let you know pathology results and when to have another routine colonoscopy by mail and/or My Chart.  I appreciate the opportunity to care for you. Iva Boop, MD, [facility]  Resume previous diet Continue present medications Await pathology results  Handouts/information given for polyps  YOU HAD AN ENDOSCOPIC PROCEDURE TODAY AT THE [facility]:   Refer to the procedure report that was given to you for any specific questions about what was found during the examination.  If the procedure report does not answer your questions, please call your gastroenterologist to clarify.  If you requested that your care partner not be given the details of your procedure findings, then the procedure report has been included in a sealed envelope for you to review at your convenience later.  YOU SHOULD EXPECT: Some feelings of bloating in the abdomen. Passage of more gas than usual.  Walking can help get rid of the air that was put into your GI tract during the procedure and reduce the bloating. If you had a lower endoscopy (such as a colonoscopy or flexible sigmoidoscopy) you may notice spotting of blood in your stool or on the toilet paper. If you underwent a bowel prep for your procedure, you may not have a normal bowel movement for a few days.  Please Note:  You might notice some irritation and congestion in your nose or some drainage.  This is from the oxygen used during your procedure.  There is no need for concern and it should clear up in a day or so.  SYMPTOMS TO REPORT IMMEDIATELY:  Following lower endoscopy (colonoscopy):  Excessive amounts of blood in the stool  Significant tenderness or worsening of abdominal pains  Swelling of the abdomen that is new, acute  Fever of 100F or higher  For urgent or emergent issues, a gastroenterologist can be reached at any hour by calling [phone]. Do not use MyChart messaging for urgent concerns.   DIET:   We do recommend a small meal at first, but then you may proceed to your regular diet.  Drink plenty of fluids but you should avoid alcoholic beverages for 24 hours.  ACTIVITY:  You should plan to take it easy for the rest of today and you should NOT DRIVE or use heavy machinery until tomorrow (because of the sedation medicines used during the test).    FOLLOW UP: Our staff will call the number listed on your records the next business day following your procedure.  We will call around 7:15- 8:00 am to check on you and address any questions or concerns that you may have regarding the information given to you following your procedure. If we do not reach you, we will leave a message.     If any biopsies were taken you will be contacted by phone or by letter within the next 1-3 weeks.  Please call us at [phone] if you have not heard about the biopsies in 3 weeks.   SIGNATURES/CONFIDENTIALITY: You and/or your care partner have signed paperwork which will be entered into your electronic medical record.  These signatures attest to the fact that that the information above on your After Visit Summary has been reviewed and is understood.  Full responsibility of the confidentiality of this discharge information lies with you and/or your care-partner.

## 2023-05-25 NOTE — Progress Notes (Signed)
Called to room to assist during endoscopic procedure.  Patient ID and intended procedure confirmed with present staff. Received instructions for my participation in the procedure from the performing physician.  

## 2023-05-26 ENCOUNTER — Telehealth: Payer: Self-pay | Admitting: *Deleted

## 2023-05-26 NOTE — Telephone Encounter (Signed)
  Follow up Call-     05/25/2023   12:39 PM 09/19/2021    1:52 PM  Call back number  Post procedure Call Back phone  # [phone]  Permission to leave phone message Yes Yes     Patient questions:  Do you have a fever, pain , or abdominal swelling? No. Pain Score  0 *  Have you tolerated food without any problems? Yes.    Have you been able to return to your normal activities? Yes.    Do you have any questions about your discharge instructions: Diet   No. Medications  No. Follow up visit  No.  Do you have questions or concerns about your Care? No.  Actions: * If pain score is 4 or above: No action needed, pain <4.

## 2023-05-28 LAB — SURGICAL PATHOLOGY

## 2023-06-01 ENCOUNTER — Other Ambulatory Visit: Payer: Self-pay

## 2023-06-01 DIAGNOSIS — Z860101 Personal history of adenomatous and serrated colon polyps: Secondary | ICD-10-CM

## 2023-07-08 ENCOUNTER — Other Ambulatory Visit: Payer: Managed Care, Other (non HMO)

## 2023-07-08 ENCOUNTER — Encounter: Payer: Managed Care, Other (non HMO) | Admitting: Genetic Counselor

## 2023-08-10 ENCOUNTER — Other Ambulatory Visit: Payer: Self-pay | Admitting: Genetic Counselor

## 2023-08-10 DIAGNOSIS — Z860101 Personal history of adenomatous and serrated colon polyps: Secondary | ICD-10-CM

## 2023-08-18 ENCOUNTER — Inpatient Hospital Stay: Payer: Managed Care, Other (non HMO) | Attending: Genetic Counselor | Admitting: Genetic Counselor

## 2023-08-18 ENCOUNTER — Inpatient Hospital Stay: Payer: Managed Care, Other (non HMO)

## 2023-08-18 ENCOUNTER — Encounter: Payer: Self-pay | Admitting: Genetic Counselor

## 2023-08-18 DIAGNOSIS — Z800 Family history of malignant neoplasm of digestive organs: Secondary | ICD-10-CM | POA: Insufficient documentation

## 2023-08-18 DIAGNOSIS — Z808 Family history of malignant neoplasm of other organs or systems: Secondary | ICD-10-CM

## 2023-08-18 DIAGNOSIS — Z860101 Personal history of adenomatous and serrated colon polyps: Secondary | ICD-10-CM

## 2023-08-18 DIAGNOSIS — Z8042 Family history of malignant neoplasm of prostate: Secondary | ICD-10-CM

## 2023-08-18 LAB — GENETIC SCREENING ORDER

## 2023-08-18 NOTE — Progress Notes (Signed)
 REFERRING PROVIDER: Avram Lupita BRAVO, MD [address] [place],  [place] [address]  PRIMARY PROVIDER:  Henriette Anes, DO  PRIMARY REASON FOR VISIT:  1. Family history of prostate cancer   2. Family history of colon cancer   3. Hx of adenomatous colonic polyps      HISTORY OF PRESENT ILLNESS:   Andrew Ryan, a 57 y.o. male, was seen for a [facility] cancer genetics consultation at the request of Dr. Avram due to a personal history of polyposis and family history of cancer.  Andrew Ryan presents to clinic today to discuss the possibility of a hereditary predisposition to cancer, genetic testing, and to further clarify his future cancer risks, as well as potential cancer risks for family members.   Andrew Ryan is a 57 y.o. male with no personal history of cancer.  He has had approximately 21 colon polyps found on colonoscopy and has elevated PSA levels, but no diagnosis of prostate cancer.  CANCER HISTORY:  Oncology History   No history exists.    Past Medical History:  Diagnosis Date   Allergy    seasonal   Arthritis    Atypical nevus 05/19/2008   Right Medial Lower Abdomen-Moderate (Dr. Erlene) and Right Lateral Mid Abdomen-Moderate to Marked (Dr. Erlene)   Atypical nevus 05/28/2010   Right Lateral Abd-Moderate, Right Upper Medial Arm-Mild and Left Lateral Abdomen-Mild (Dr. Erlene)   BCC (basal cell carcinoma of skin) 05/19/2008   Left Medial Shoulder (Dr. Erlene)   BCC (basal cell carcinoma of skin) 02/03/2011   Above Right Ear (MOH's)   BCC (basal cell carcinoma of skin) 06/30/2011   Left Upper Back (Cx3,5FU) and Upper Mid Back (Cx3,5FU)   BCC (basal cell carcinoma of skin) 03/15/2014   Right Forearm (tx p bx)   Family history of colon cancer    Family history of prostate cancer    GERD (gastroesophageal reflux disease)    on meds   H. pylori infection    Histoplasmosis    Hx of adenomatous colonic polyps 07/29/2017   Seasonal allergies    Skin cancer, basal  cell    Squamous cell skin cancer    Stroke (HCC) 09/2016   Superficial basal cell carcinoma (BCC) 05/19/2008   Left Lateral Shoulder, and Left Lateral Neck (Dr. Erlene)    Past Surgical History:  Procedure Laterality Date   BASAL CELL CARCINOMA EXCISION  05/22/2023   COLONOSCOPY  2018   CG-Mira(exc)-TA   CYSTOSCOPY  2015   INGUINAL HERNIA REPAIR Right 02/13/2021   Procedure: HERNIA REPAIR INGUINAL ADULT W/MESH;  Surgeon: Kallie Manuelita BROCKS, MD;  Location: [facility];  Service: General;  Laterality: Right;   INGUINAL HERNIA REPAIR Left 1991   KNEE ARTHROSCOPY Bilateral    POLYPECTOMY  2018   TA   UMBILICAL HERNIA REPAIR     UPPER GASTROINTESTINAL ENDOSCOPY      Social History   Socioeconomic History   Marital status: Married    Spouse name: Not on file   Number of children: 3   Years of education: Not on file   Highest education level: Not on file  Occupational History   Occupation: electrical/instrumentation tech  Tobacco Use   Smoking status: Never   Smokeless tobacco: Never  Vaping Use   Vaping status: Never Used  Substance and Sexual Activity   Alcohol use: Not Currently    Alcohol/week: 2.0 standard drinks of alcohol    Types: 2 Standard drinks or equivalent per week  Comment: occ   Drug use: No   Sexual activity: Not on file  Other Topics Concern   Not on file  Social History Narrative   Married, 3 daughters   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  Significant diagnoses are listed below: Family History   Problem Relation Age of Onset   Colon polyps Mother    Congestive Heart Failure Father    COPD Father    Prostate cancer Brother 36   Prostate cancer Brother 29   Colon cancer Maternal Grandmother 46   Lung cancer Maternal Grandfather        smoker, coal miner   Heart attack Maternal Grandfather    Lung cancer Paternal Grandfather        smoker, forensic scientist   Esophageal cancer Neg Hx    Stomach cancer Neg Hx    Rectal cancer Neg Hx    Crohn's disease Neg Hx    Ulcerative colitis Neg Hx      The patient has had multiple BCC due to sun exposure when young.  He had two full brothers, both with prostate cancer and a paternal half sister who is healthy.  His father is deceased and his mother is living.  The patient's mother has a history of colon polyps.  She has one brother who is cancer free.  Her parents are deceased.  Her father had lung cancer and her mother had colon cancer at 46.  The patient's father died of a heart attack.  He had two brothers who were cancer free.  His father died from lung cancer.  Andrew Ryan is unaware of previous family history of genetic testing for hereditary cancer risks. There is no reported Ashkenazi Jewish ancestry. There is no known consanguinity.  GENETIC COUNSELING ASSESSMENT: Andrew Ryan is a 57 y.o. male with a personal history of polyposis and family history of prostate and colon cancer which is somewhat suggestive of a hereditary cancer syndrome and predisposition to cancer given the high number of colon polyps. We, therefore, discussed and recommended the following at today's visit.   DISCUSSION: We discussed that, in general, most cancer is not inherited in families, but instead is sporadic or familial. Sporadic cancers occur by chance and typically happen at older ages (>50 years) as this type of cancer is caused by genetic changes acquired during an individual's lifetime. Some families have more cancers than would be expected by chance;  however, the ages or types of cancer are not consistent with a known genetic mutation or known genetic mutations have been ruled out. This type of familial cancer is thought to be due to a combination of multiple genetic, environmental, hormonal, and lifestyle factors. While this combination of factors likely increases the risk of cancer, the exact source of this risk is not currently identifiable or testable.  We discussed that many people have colon polyps, but most people have fewer than 5.  When an individual has a lifetime count of 10 or more colon polyps, it is considered  polyposis and is known to increase the chance for having a hereditary syndrome that could cause polyps and colon cancer.  Most hereditary cases associated with polyposis are due to APC mutations.  There are other genes that can be associated with hereditary polyposis syndromes.  These include MUTYH and BMPR1A.  We discussed that testing is beneficial for several reasons including knowing how to follow individuals after completing their treatment, identifying whether potential treatment options such as PARP inhibitors would be beneficial, and understand if other family members could be at risk for cancer and allow them to undergo genetic testing.   We reviewed the characteristics, features and inheritance patterns of hereditary cancer syndromes. We also discussed genetic testing, including the appropriate family members to test, the process of testing, insurance coverage and turn-around-time for results. We discussed the implications of a negative, positive, carrier and/or variant of uncertain significant result. Andrew Ryan  was offered a common hereditary cancer panel (36+ genes) and an expanded pan-cancer panel (70+ genes). Andrew Ryan was informed of the benefits and limitations of each panel, including that expanded pan-cancer panels contain genes that do not have clear management guidelines at this point in time.  We also discussed  that as the number of genes included on a panel increases, the chances of variants of uncertain significance increases. Andrew Ryan decided to pursue genetic testing for the CancerNext+RNA gene panel.   The [facility] CancerNext+RNAinsight Panel includes sequencing, rearrangement analysis, and RNA analysis for the following 39 genes: APC, ATM, BAP1, BARD1, BMPR1A, BRCA1, BRCA2, BRIP1, CDH1, CDKN2A, CHEK2, FH, FLCN, MET, MLH1, MSH2, MSH6, MUTYH, NF1, NTHL1, PALB2, PMS2, PTEN, RAD51C, RAD51D, SMAD4, STK11, TP53, TSC1, TSC2, and VHL (sequencing and deletion/duplication); AXIN2, HOXB13, MBD4, MSH3, POLD1 and POLE (sequencing only); EPCAM and GREM1 (deletion/duplication only).   Based on Andrew Ryan's personal history of polyposis, he meets medical criteria for genetic testing. Despite that he meets criteria, he may still have an out of pocket cost. We discussed that if his out of pocket cost for testing is over $100, the laboratory will call and confirm whether he wants to proceed with testing.  If the out of pocket cost of testing is less than $100 he will be billed by the genetic testing laboratory.   We discussed that some people do not want to undergo genetic testing due to fear of genetic discrimination.  The Genetic Information Nondiscrimination Act (GINA) was signed into federal law in 2008. GINA prohibits health insurers and most employers from discriminating against individuals based on genetic information (including the results of genetic tests and family history information). According to GINA, health insurance companies cannot consider genetic information to be a preexisting condition, nor can they use it to make decisions regarding coverage or rates. GINA also makes it illegal for most employers to use genetic information in making decisions about hiring, firing, promotion, or terms of employment. It is important to note that GINA does not offer protections for life insurance, disability insurance, or  long-term care insurance. GINA does not apply to those in the [employer], those who work for companies with less than 15 employees, and new life insurance or long-term disability insurance policies.  Health status due to a cancer diagnosis is not protected under GINA. More information about GINA can be found by visiting eliteclients.be.  PLAN: After considering the risks, benefits, and limitations, Andrew Ryan provided informed consent to pursue genetic testing and the blood sample was sent to [facility] for analysis of the CancerNext+RNA.  Results should be available within approximately 2-3 weeks' time, at which point they will be disclosed by telephone to Andrew Ryan, as will any additional recommendations warranted by these results. Andrew Ryan will receive a summary of his genetic counseling visit and a copy of his results once available. This information will also be available in Epic.   Lastly, we encouraged Andrew Ryan to remain in contact with cancer genetics annually so that we can continuously update the family history and inform him of any changes in cancer genetics and testing that may be of benefit for this family.   Andrew Ryan questions were answered to his satisfaction today. Our contact information was provided should additional questions or concerns arise. Thank you for the referral and allowing us  to share in the care of your patient.   Angelica Wix P. Perri, MS, CGC Licensed, Patent Attorney [email or web address] phone: [phone]  The patient was seen for a total of 50 minutes in face-to-face genetic counseling.  The patient brought his wife. Drs. Lanny Stalls, and/or Gudena were available for questions, if needed..    _______________________________________________________________________ For Office Staff:  Number of people involved in session: 2 Was an Intern/ student involved with case: no

## 2023-09-01 ENCOUNTER — Encounter: Payer: Self-pay | Admitting: Genetic Counselor

## 2023-09-01 ENCOUNTER — Ambulatory Visit: Payer: Self-pay | Admitting: Genetic Counselor

## 2023-09-01 ENCOUNTER — Telehealth: Payer: Self-pay | Admitting: Genetic Counselor

## 2023-09-01 DIAGNOSIS — Z1379 Encounter for other screening for genetic and chromosomal anomalies: Secondary | ICD-10-CM | POA: Insufficient documentation

## 2023-09-01 NOTE — Telephone Encounter (Signed)
 Revealed negative genetic testing.  Discussed that we do not know why he has polyposis or why there is cancer in the family. It could be due to a different gene that we are not testing, or maybe our current technology may not be able to pick something up.  It will be important for him to keep in contact with genetics to keep up with whether additional testing may be needed.

## 2023-09-01 NOTE — Progress Notes (Signed)
 HPI:  Andrew Ryan was previously seen in the [facility] due to a personal history of polyposis and family history of cancer and concerns regarding a hereditary predisposition to cancer. Please refer to our prior cancer genetics clinic note for more information regarding our discussion, assessment and recommendations, at the time. Andrew Ryan recent genetic test results were disclosed to him, as were recommendations warranted by these results. These results and recommendations are discussed in more detail below.  CANCER HISTORY:  Oncology History   No history exists.    FAMILY HISTORY:  We obtained a detailed, 4-generation family history.  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  He had two full brothers, both with prostate cancer and a paternal half sister who is healthy.  His father is deceased and his mother is living.   The patient's mother has a history of colon polyps.  She has one brother who is cancer free.  Her parents are deceased.  Her father had lung cancer and her mother had colon cancer at 32.   The patient's father died of a heart attack.  He had two brothers who were cancer free.  His father died from lung cancer.   Andrew Ryan is unaware of previous family history of genetic testing for hereditary cancer risks. There is no reported Ashkenazi Jewish ancestry. There is no  known consanguinity.  GENETIC TEST RESULTS: Genetic testing reported out on August 28, 2023 through the CancerNext-[facility]RNA[facility] cancer panel found no pathogenic mutations. The [facility]RNA[facility] Panel includes sequencing, rearrangement analysis, and RNA analysis for the following 39 genes: APC, ATM, BAP1, BARD1, BMPR1A, BRCA1, BRCA2, BRIP1, CDH1, CDKN2A, CHEK2, FH, FLCN, MET, MLH1, MSH2, MSH6, MUTYH, NF1, NTHL1, PALB2, PMS2, PTEN, RAD51C, RAD51D, SMAD4, STK11, TP53, TSC1, TSC2, and VHL (sequencing and deletion/duplication); AXIN2, HOXB13, MBD4, MSH3, POLD1 and POLE (sequencing only); EPCAM and GREM1 (deletion/duplication only). The test report has been scanned into EPIC and is located under the Molecular Pathology section of the Results Review tab.  A portion of the result report is included below for reference.     We discussed with Andrew Ryan that because current genetic testing is not perfect, it is possible there may be a gene mutation in one of these genes that current testing cannot detect, but that chance is small.  We also discussed, that there could be another gene that has not yet been discovered, or that we have not yet tested, that is responsible for the cancer diagnoses in the family. It is also possible there is a hereditary cause for the cancer in the family that Andrew Ryan did not inherit and therefore was not identified in his testing.  Therefore, it is important to  remain in touch with cancer genetics in the future so that we can continue to offer Andrew Ryan the most up to date genetic testing.   ADDITIONAL GENETIC TESTING: We discussed with Andrew Ryan that there are other genes that are associated with increased cancer risk that can be analyzed. Should Andrew Ryan wish to pursue additional genetic testing, we are happy to discuss and coordinate this testing, at any time.    CANCER SCREENING RECOMMENDATIONS: Andrew Ryan test result is considered negative (normal).  This  means that we have not identified a hereditary cause for his personal history of polyps and family history of cancer at this time. Most cancers happen by chance and this negative test suggests that his personal history of polyps may fall into this category.    Possible reasons for Andrew Ryan's negative genetic test include:  1. There may be a gene mutation in one of these genes that current testing methods cannot detect but that chance is small.  2. There could be another gene that has not yet been discovered, or that we have not yet tested, that is responsible for the cancer diagnoses in the family.  3.  There may be no hereditary risk for cancer in the family. The cancers in Andrew Ryan and/or his family may be sporadic/familial or due to other genetic and environmental factors. 4. It is also possible there is a hereditary cause for the cancer in the family that Andrew Ryan did not inherit.  Therefore, it is recommended he continue to follow the cancer management and screening guidelines provided by his primary healthcare provider. An individual's cancer risk and medical management are not determined by genetic test results alone. Overall cancer risk assessment incorporates additional factors, including personal medical history, family history, and any available genetic information that may result in a personalized plan for cancer prevention and surveillance  RECOMMENDATIONS FOR FAMILY MEMBERS:   Since he did not inherit a identifiable mutation in a cancer predisposition gene included on this panel, his children could not have inherited a known mutation from him in one of these genes. Individuals in this family might be at some increased risk of developing cancer, over the general population risk, simply due to the family history of cancer.  We recommended women in this family have a yearly mammogram beginning at age 68, or 10 years younger than the earliest onset of cancer, an annual clinical breast  exam, and perform monthly breast self-exams. Women in this family should also have a gynecological exam as recommended by their primary provider. All family members should be referred for colonoscopy starting at age 20, or 2 years younger than the earliest onset of cancer. It is also possible there is a hereditary cause for the cancer in Mr. Lunde family that he did not inherit and therefore was not identified in him.  Based on Mr. Papadakis family history, we recommended his brothers have genetic counseling and testing. Mr. Sanzone will let us  know if we can be of any assistance in coordinating genetic counseling and/or testing for this family member.   FOLLOW-UP: Lastly, we discussed with Mr. Porte that cancer genetics is a rapidly advancing field and it is possible that new genetic tests will be appropriate for him and/or his family members in the future. We encouraged him to remain in contact with cancer genetics on an annual basis so we can update his personal and family histories and let him know of advances in cancer genetics that may benefit this  family.   Our contact number was provided. Mr. Sieling questions were answered to his satisfaction, and he knows he is welcome to call us  at anytime with additional questions or concerns.   Darice Monte, MS, [facility] Licensed, Certified Genetic Counselor [email or web address]

## 2024-01-19 ENCOUNTER — Other Ambulatory Visit: Payer: Self-pay

## 2024-01-19 DIAGNOSIS — R972 Elevated prostate specific antigen [PSA]: Secondary | ICD-10-CM

## 2024-02-23 ENCOUNTER — Other Ambulatory Visit

## 2024-03-19 ENCOUNTER — Ambulatory Visit
Admission: RE | Admit: 2024-03-19 | Discharge: 2024-03-19 | Disposition: A | Discharge status: Home / Self Care | Source: Ambulatory Visit

## 2024-03-19 DIAGNOSIS — R972 Elevated prostate specific antigen [PSA]: Secondary | ICD-10-CM

## 2024-03-19 MED ORDER — GADOPICLENOL 0.5 MMOL/ML IV SOLN
10.0000 mL | Freq: Once | INTRAVENOUS | Status: AC | PRN
  Administered 2024-03-19: 10 mL via INTRAVENOUS
